# Patient Record
Sex: Male | Born: 1990 | Race: Black or African American | Hispanic: No | Marital: Single | Smoking: Current every day smoker
Health system: Southern US, Community
[De-identification: ages and names within clinical notes are randomized; demographics above are authoritative.]

## PROBLEM LIST (undated history)

## (undated) DIAGNOSIS — J45909 Unspecified asthma, uncomplicated: Secondary | ICD-10-CM

## (undated) HISTORY — PX: APPENDECTOMY: SHX54

---
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
## 2009-11-08 ENCOUNTER — Emergency Department (HOSPITAL_COMMUNITY)
Admission: EM | Admit: 2009-11-08 | Discharge: 2009-11-08 | Payer: Self-pay | Source: Home / Self Care | Admitting: Emergency Medicine

## 2010-01-11 ENCOUNTER — Emergency Department (HOSPITAL_COMMUNITY)
Admission: EM | Admit: 2010-01-11 | Discharge: 2010-01-11 | Payer: Self-pay | Source: Home / Self Care | Admitting: Emergency Medicine

## 2010-01-14 ENCOUNTER — Emergency Department (HOSPITAL_COMMUNITY): Admission: EM | Admit: 2010-01-14 | Discharge: 2009-12-27 | Payer: Self-pay | Admitting: Emergency Medicine

## 2010-03-08 ENCOUNTER — Emergency Department (HOSPITAL_COMMUNITY)
Admission: EM | Admit: 2010-03-08 | Discharge: 2010-03-08 | Payer: Self-pay | Source: Home / Self Care | Admitting: Emergency Medicine

## 2010-03-08 LAB — RAPID STREP SCREEN (MED CTR MEBANE ONLY): Streptococcus, Group A Screen (Direct): POSITIVE — AB

## 2010-04-22 LAB — URINALYSIS, ROUTINE W REFLEX MICROSCOPIC
Bilirubin Urine: NEGATIVE
Glucose, UA: NEGATIVE mg/dL
Hgb urine dipstick: NEGATIVE
Ketones, ur: NEGATIVE mg/dL
Nitrite: NEGATIVE
Protein, ur: NEGATIVE mg/dL
Specific Gravity, Urine: 1.025 (ref 1.005–1.030)
Urobilinogen, UA: 1 mg/dL (ref 0.0–1.0)
pH: 7 (ref 5.0–8.0)

## 2010-04-22 LAB — HEMOCCULT GUIAC POC 1CARD (OFFICE): Fecal Occult Bld: NEGATIVE

## 2010-04-24 ENCOUNTER — Emergency Department (HOSPITAL_COMMUNITY): Payer: Medicaid Other

## 2010-04-24 ENCOUNTER — Emergency Department (HOSPITAL_COMMUNITY)
Admission: EM | Admit: 2010-04-24 | Discharge: 2010-04-24 | Disposition: A | Discharge status: Home / Self Care | Payer: Medicaid Other | Attending: Emergency Medicine | Admitting: Emergency Medicine

## 2010-04-24 DIAGNOSIS — R220 Localized swelling, mass and lump, head: Secondary | ICD-10-CM | POA: Insufficient documentation

## 2010-04-24 DIAGNOSIS — Y92009 Unspecified place in unspecified non-institutional (private) residence as the place of occurrence of the external cause: Secondary | ICD-10-CM | POA: Insufficient documentation

## 2010-04-24 DIAGNOSIS — F341 Dysthymic disorder: Secondary | ICD-10-CM | POA: Insufficient documentation

## 2010-04-24 DIAGNOSIS — S01501A Unspecified open wound of lip, initial encounter: Secondary | ICD-10-CM | POA: Insufficient documentation

## 2010-04-24 DIAGNOSIS — H538 Other visual disturbances: Secondary | ICD-10-CM | POA: Insufficient documentation

## 2010-04-24 DIAGNOSIS — Z79899 Other long term (current) drug therapy: Secondary | ICD-10-CM | POA: Insufficient documentation

## 2010-04-24 DIAGNOSIS — R51 Headache: Secondary | ICD-10-CM | POA: Insufficient documentation

## 2010-04-24 DIAGNOSIS — R221 Localized swelling, mass and lump, neck: Secondary | ICD-10-CM | POA: Insufficient documentation

## 2010-05-20 ENCOUNTER — Emergency Department (HOSPITAL_COMMUNITY): Payer: Medicaid Other

## 2010-05-20 ENCOUNTER — Emergency Department (HOSPITAL_COMMUNITY)
Admission: EM | Admit: 2010-05-20 | Discharge: 2010-05-20 | Disposition: A | Discharge status: Other Acute Inpatient Hospital | Payer: Medicaid Other | Source: Home / Self Care | Attending: Emergency Medicine | Admitting: Emergency Medicine

## 2010-05-20 ENCOUNTER — Observation Stay (HOSPITAL_COMMUNITY)
Admission: EM | Admit: 2010-05-20 | Discharge: 2010-05-21 | Disposition: A | Discharge status: Home / Self Care | Payer: Medicaid Other | Source: Ambulatory Visit | Attending: General Surgery | Admitting: General Surgery

## 2010-05-20 DIAGNOSIS — S71009A Unspecified open wound, unspecified hip, initial encounter: Principal | ICD-10-CM | POA: Insufficient documentation

## 2010-05-20 DIAGNOSIS — R209 Unspecified disturbances of skin sensation: Secondary | ICD-10-CM | POA: Insufficient documentation

## 2010-05-20 DIAGNOSIS — S71109A Unspecified open wound, unspecified thigh, initial encounter: Principal | ICD-10-CM | POA: Insufficient documentation

## 2010-05-20 DIAGNOSIS — Z23 Encounter for immunization: Secondary | ICD-10-CM | POA: Insufficient documentation

## 2010-05-20 DIAGNOSIS — Y929 Unspecified place or not applicable: Secondary | ICD-10-CM | POA: Insufficient documentation

## 2010-05-20 DIAGNOSIS — F3289 Other specified depressive episodes: Secondary | ICD-10-CM | POA: Insufficient documentation

## 2010-05-20 DIAGNOSIS — W3400XA Accidental discharge from unspecified firearms or gun, initial encounter: Secondary | ICD-10-CM | POA: Insufficient documentation

## 2010-05-20 DIAGNOSIS — J45909 Unspecified asthma, uncomplicated: Secondary | ICD-10-CM | POA: Insufficient documentation

## 2010-05-20 DIAGNOSIS — S3120XA Unspecified open wound of penis, initial encounter: Secondary | ICD-10-CM | POA: Insufficient documentation

## 2010-05-20 DIAGNOSIS — F329 Major depressive disorder, single episode, unspecified: Secondary | ICD-10-CM | POA: Insufficient documentation

## 2010-05-20 DIAGNOSIS — F17200 Nicotine dependence, unspecified, uncomplicated: Secondary | ICD-10-CM | POA: Insufficient documentation

## 2010-05-20 LAB — DIFFERENTIAL
Basophils Absolute: 0 10*3/uL (ref 0.0–0.1)
Eosinophils Absolute: 0.2 10*3/uL (ref 0.0–0.7)
Lymphocytes Relative: 46 % (ref 12–46)
Lymphs Abs: 2.5 10*3/uL (ref 0.7–4.0)
Monocytes Absolute: 0.6 10*3/uL (ref 0.1–1.0)
Monocytes Relative: 10 % (ref 3–12)
Neutro Abs: 2.1 10*3/uL (ref 1.7–7.7)
Neutrophils Relative %: 39 % — ABNORMAL LOW (ref 43–77)

## 2010-05-20 LAB — POCT I-STAT, CHEM 8
BUN: 7 mg/dL (ref 6–23)
Calcium, Ion: 1.18 mmol/L (ref 1.12–1.32)
Chloride: 103 mEq/L (ref 96–112)
Glucose, Bld: 103 mg/dL — ABNORMAL HIGH (ref 70–99)
HCT: 51 % (ref 39.0–52.0)
Hemoglobin: 17.3 g/dL — ABNORMAL HIGH (ref 13.0–17.0)
Potassium: 3.5 mEq/L (ref 3.5–5.1)

## 2010-05-20 LAB — CBC
HCT: 45.2 % (ref 39.0–52.0)
Hemoglobin: 14.7 g/dL (ref 13.0–17.0)
MCH: 27.3 pg (ref 26.0–34.0)
MCHC: 32.5 g/dL (ref 30.0–36.0)
MCV: 83.9 fL (ref 78.0–100.0)
RBC: 5.39 MIL/uL (ref 4.22–5.81)
RDW: 15.6 % — ABNORMAL HIGH (ref 11.5–15.5)
WBC: 5.4 10*3/uL (ref 4.0–10.5)

## 2010-05-20 LAB — TYPE AND SCREEN
ABO/RH(D): O POS
Antibody Screen: NEGATIVE

## 2010-05-20 LAB — ABO/RH: ABO/RH(D): O POS

## 2010-05-20 LAB — MRSA PCR SCREENING: MRSA by PCR: NEGATIVE

## 2010-05-21 LAB — CBC
HCT: 40.5 % (ref 39.0–52.0)
Hemoglobin: 13.6 g/dL (ref 13.0–17.0)
MCH: 28.4 pg (ref 26.0–34.0)
MCHC: 33.6 g/dL (ref 30.0–36.0)
MCV: 84.6 fL (ref 78.0–100.0)
Platelets: 229 10*3/uL (ref 150–400)
RBC: 4.79 MIL/uL (ref 4.22–5.81)
RDW: 15.9 % — ABNORMAL HIGH (ref 11.5–15.5)
WBC: 8.3 10*3/uL (ref 4.0–10.5)
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
## 2010-06-25 NOTE — Op Note (Signed)
[name], Derek Davis                        ACCOUNT NO.:  [id number]   MEDICAL RECORD NO.:  [id number]                   PATIENT TYPE:  AMB   LOCATION:  DSC                                  FACILITY:  [facility]   PHYSICIAN:  Leonia Corona, M.D.               DATE OF BIRTH:  05/07/1990   DATE OF PROCEDURE:  06/02/2003  DATE OF DISCHARGE:                                 OPERATIVE REPORT   PREOPERATIVE DIAGNOSIS:  Mild degree of phimosis.   POSTOPERATIVE DIAGNOSIS:  Mild degree of phimosis.   OPERATION PERFORMED:  Circumcision.   SURGEON:  Leonia Corona, M.D.   ASSISTANT:  Nurse.   ANESTHESIA:  General laryngeal mask.   INDICATIONS FOR PROCEDURE:  This 21 year old male child was essentially seen  and evaluated for an elective circumcision.  Clinical examination revealed a  very mild degree of phimosis; however, per the request of the mother,  circumcision was performed.   DESCRIPTION OF PROCEDURE:  The patient was brought to the operating room and  placed supine on the operating table.  General laryngeal mask anesthesia was  given.  The penis and surrounding area of perineum was cleaned, prepped and  draped in the usual manner.  Approximately 4 mL of 0.25% Marcaine without  epinephrine was infiltrated at the base of the penis dorsally for dorsal  penile block for postoperative pain control.  Two hemostats were applied to  the preputial skin which was first dilated and retracted completely to  expose the coronal sulcus and adhesions between the preputial skin and the  glans were cleared.  Circumferential incision was marked on the outer  preputial skin at the level of the coronal sulcus and a circumferential  incision was made with knife.  The outer preputial skin was dissected with  the help of scissors.  Blunt and sharp dissection using cautery to divide  the fibrous connective tissue between the inner and outer layer of the  preputial skin.  Having separated the outer  preputial skin, a dorsal slit  was created by crushing with a clamp and dividing with scissors.  The inner  layer was divided approximately 4 to 5 mm short of reaching up to the  coronary sulcus.  The inner skin was then divided with scissors  circumferentially leaving a cuff of about 5 mm surrounding the coronal  sulcus.  The oozing and bleeding spots were cauterized.  The two divided  layers of the preputial skin were then approximated using 4-0 chromic  catgut.  The first suture was placed at the frenulum where a bleeder was  first ligated using 5-0 chromic catgut and then a U-stitch was placed and  tagged.  The second stitch was placed at 12 o'clock position approximating  inner and outer preputial skin and the stitch was tagged. Then five stitches  were placed in each half of the circumference in interrupted fashion using 4-  0 chromic catgut.  After  completing the suture line, no oozing or bleeding  was noted.  Tagged sutures were cut. Wound was cleaned and dried.  It was  wrapped with Vaseline gauze and sterile dressing,  held in position with  Coban.  Exposed part of the glans penis was covered with Neosporin ointment.  The patient tolerated the procedure very well which was smooth and  uneventful.  The patient was later extubated and transported to recovery  room in good stable condition.                                               Leonia Corona, M.D.    SF/MEDQ  D:  06/02/2003  T:  06/02/2003  Job:  [id number]   cc:   [facility]

## 2010-11-02 ENCOUNTER — Emergency Department (HOSPITAL_COMMUNITY)
Admission: EM | Admit: 2010-11-02 | Discharge: 2010-11-03 | Discharge status: Against Medical Advice | Payer: Medicaid Other | Attending: Emergency Medicine | Admitting: Emergency Medicine

## 2010-11-02 DIAGNOSIS — Z0389 Encounter for observation for other suspected diseases and conditions ruled out: Secondary | ICD-10-CM | POA: Insufficient documentation

## 2010-11-10 ENCOUNTER — Emergency Department (HOSPITAL_COMMUNITY)
Admission: EM | Admit: 2010-11-10 | Discharge: 2010-11-10 | Disposition: A | Discharge status: Home / Self Care | Payer: Medicaid Other | Attending: Emergency Medicine | Admitting: Emergency Medicine

## 2010-11-10 DIAGNOSIS — R109 Unspecified abdominal pain: Secondary | ICD-10-CM | POA: Insufficient documentation

## 2010-11-10 DIAGNOSIS — R10816 Epigastric abdominal tenderness: Secondary | ICD-10-CM | POA: Insufficient documentation

## 2010-11-18 ENCOUNTER — Emergency Department (HOSPITAL_COMMUNITY): Payer: Medicaid Other

## 2010-11-18 ENCOUNTER — Inpatient Hospital Stay (HOSPITAL_COMMUNITY)
Admission: EM | Admit: 2010-11-18 | Discharge: 2010-11-21 | DRG: 087 | Disposition: A | Discharge status: Home / Self Care | Payer: Medicaid Other | Attending: General Surgery | Admitting: General Surgery

## 2010-11-18 ENCOUNTER — Inpatient Hospital Stay (HOSPITAL_COMMUNITY): Payer: Medicaid Other

## 2010-11-18 DIAGNOSIS — S060X9A Concussion with loss of consciousness of unspecified duration, initial encounter: Secondary | ICD-10-CM

## 2010-11-18 DIAGNOSIS — S065XAA Traumatic subdural hemorrhage with loss of consciousness status unknown, initial encounter: Principal | ICD-10-CM | POA: Diagnosis present

## 2010-11-18 DIAGNOSIS — S065X9A Traumatic subdural hemorrhage with loss of consciousness of unspecified duration, initial encounter: Principal | ICD-10-CM | POA: Diagnosis present

## 2010-11-18 DIAGNOSIS — S069X9A Unspecified intracranial injury with loss of consciousness of unspecified duration, initial encounter: Secondary | ICD-10-CM

## 2010-11-18 DIAGNOSIS — Y92009 Unspecified place in unspecified non-institutional (private) residence as the place of occurrence of the external cause: Secondary | ICD-10-CM

## 2010-11-18 DIAGNOSIS — IMO0002 Reserved for concepts with insufficient information to code with codable children: Secondary | ICD-10-CM | POA: Diagnosis present

## 2010-11-18 LAB — DIFFERENTIAL
Basophils Absolute: 0 10*3/uL (ref 0.0–0.1)
Basophils Relative: 0 % (ref 0–1)
Eosinophils Absolute: 0.2 10*3/uL (ref 0.0–0.7)
Eosinophils Relative: 2 % (ref 0–5)
Lymphocytes Relative: 15 % (ref 12–46)
Monocytes Absolute: 0.7 10*3/uL (ref 0.1–1.0)

## 2010-11-18 LAB — POCT I-STAT, CHEM 8
Calcium, Ion: 1.08 mmol/L — ABNORMAL LOW (ref 1.12–1.32)
Creatinine, Ser: 1.3 mg/dL (ref 0.50–1.35)
Glucose, Bld: 101 mg/dL — ABNORMAL HIGH (ref 70–99)
HCT: 48 % (ref 39.0–52.0)
Hemoglobin: 16.3 g/dL (ref 13.0–17.0)
TCO2: 16 mmol/L (ref 0–100)

## 2010-11-18 LAB — ACETAMINOPHEN LEVEL: Acetaminophen (Tylenol), Serum: <15 ug/mL (ref 10–30)

## 2010-11-18 LAB — GLUCOSE, CAPILLARY: Glucose-Capillary: 85 mg/dL (ref 70–99)

## 2010-11-18 LAB — CBC
HCT: 44.1 % (ref 39.0–52.0)
MCHC: 34.5 g/dL (ref 30.0–36.0)
Platelets: 243 10*3/uL (ref 150–400)
RDW: 14.4 % (ref 11.5–15.5)
WBC: 10.4 10*3/uL (ref 4.0–10.5)

## 2010-11-18 LAB — MRSA PCR SCREENING: MRSA by PCR: NEGATIVE

## 2010-11-18 LAB — RAPID URINE DRUG SCREEN, HOSP PERFORMED
Barbiturates: NOT DETECTED
Cocaine: NOT DETECTED

## 2010-11-18 LAB — HEPATIC FUNCTION PANEL
ALT: 41 U/L (ref 0–53)
Bilirubin, Direct: <0.1 mg/dL (ref 0.0–0.3)

## 2010-11-18 LAB — SALICYLATE LEVEL: Salicylate Lvl: <2 mg/dL — ABNORMAL LOW (ref 2.8–20.0)

## 2010-11-18 MED ORDER — IOHEXOL 300 MG/ML  SOLN
100.0000 mL | Freq: Once | INTRAMUSCULAR | Status: AC | PRN
  Administered 2010-11-18: 100 mL via INTRAVENOUS

## 2010-11-19 LAB — BASIC METABOLIC PANEL
CO2: 25 mEq/L (ref 19–32)
Glucose, Bld: 90 mg/dL (ref 70–99)
Potassium: 4.2 mEq/L (ref 3.5–5.1)
Sodium: 138 mEq/L (ref 135–145)

## 2010-11-19 LAB — CBC
HCT: 42.7 % (ref 39.0–52.0)
Hemoglobin: 14.3 g/dL (ref 13.0–17.0)
MCH: 29.4 pg (ref 26.0–34.0)
RBC: 4.87 MIL/uL (ref 4.22–5.81)

## 2010-11-22 ENCOUNTER — Emergency Department (HOSPITAL_COMMUNITY): Payer: Medicaid Other

## 2010-11-22 ENCOUNTER — Emergency Department (HOSPITAL_COMMUNITY)
Admission: EM | Admit: 2010-11-22 | Discharge: 2010-11-22 | Disposition: A | Discharge status: Home / Self Care | Payer: Medicaid Other | Attending: Emergency Medicine | Admitting: Emergency Medicine

## 2010-11-22 DIAGNOSIS — R110 Nausea: Secondary | ICD-10-CM | POA: Insufficient documentation

## 2010-11-22 DIAGNOSIS — R51 Headache: Secondary | ICD-10-CM | POA: Insufficient documentation

## 2010-11-22 DIAGNOSIS — S0990XA Unspecified injury of head, initial encounter: Secondary | ICD-10-CM | POA: Insufficient documentation

## 2010-11-22 DIAGNOSIS — R220 Localized swelling, mass and lump, head: Secondary | ICD-10-CM | POA: Insufficient documentation

## 2010-11-22 DIAGNOSIS — K219 Gastro-esophageal reflux disease without esophagitis: Secondary | ICD-10-CM | POA: Insufficient documentation

## 2010-11-22 DIAGNOSIS — F329 Major depressive disorder, single episode, unspecified: Secondary | ICD-10-CM | POA: Insufficient documentation

## 2010-11-22 DIAGNOSIS — Z79899 Other long term (current) drug therapy: Secondary | ICD-10-CM | POA: Insufficient documentation

## 2010-11-22 DIAGNOSIS — R221 Localized swelling, mass and lump, neck: Secondary | ICD-10-CM | POA: Insufficient documentation

## 2010-11-22 DIAGNOSIS — IMO0002 Reserved for concepts with insufficient information to code with codable children: Secondary | ICD-10-CM | POA: Insufficient documentation

## 2010-11-22 DIAGNOSIS — F3289 Other specified depressive episodes: Secondary | ICD-10-CM | POA: Insufficient documentation
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
## 2011-04-02 MED ORDER — ONDANSETRON HCL 4 MG/2ML IJ SOLN
INTRAMUSCULAR | Status: AC
  Filled 2011-04-02: qty 2

## 2011-04-02 MED ORDER — HYDROMORPHONE HCL PF 1 MG/ML IJ SOLN
INTRAMUSCULAR | Status: AC
  Filled 2011-04-02: qty 1

## 2013-04-03 ENCOUNTER — Emergency Department (HOSPITAL_COMMUNITY)
Admission: EM | Admit: 2013-04-03 | Discharge: 2013-04-03 | Disposition: A | Discharge status: Home / Self Care | Payer: Self-pay | Attending: Emergency Medicine | Admitting: Emergency Medicine

## 2013-04-03 ENCOUNTER — Emergency Department (HOSPITAL_COMMUNITY): Payer: Medicaid Other

## 2013-04-03 DIAGNOSIS — R51 Headache: Secondary | ICD-10-CM | POA: Insufficient documentation

## 2013-04-03 DIAGNOSIS — R0602 Shortness of breath: Secondary | ICD-10-CM | POA: Insufficient documentation

## 2013-04-03 DIAGNOSIS — R110 Nausea: Secondary | ICD-10-CM | POA: Insufficient documentation

## 2013-04-03 DIAGNOSIS — E860 Dehydration: Secondary | ICD-10-CM | POA: Insufficient documentation

## 2013-04-03 LAB — I-STAT CHEM 8, ED
BUN: 7 mg/dL (ref 6–23)
CALCIUM ION: 1.23 mmol/L (ref 1.12–1.23)
Chloride: 102 mEq/L (ref 96–112)
Creatinine, Ser: 0.8 mg/dL (ref 0.50–1.35)
Glucose, Bld: 80 mg/dL (ref 70–99)
HEMATOCRIT: 47 % (ref 39.0–52.0)
HEMOGLOBIN: 16 g/dL (ref 13.0–17.0)
Potassium: 4.1 mEq/L (ref 3.7–5.3)
SODIUM: 140 meq/L (ref 137–147)
TCO2: 25 mmol/L (ref 0–100)

## 2013-04-03 MED ORDER — ONDANSETRON 4 MG PO TBDP
4.0000 mg | ORAL_TABLET | Freq: Once | ORAL | Status: AC
  Administered 2013-04-03: 4 mg via ORAL
  Filled 2013-04-03: qty 1

## 2013-04-03 MED ORDER — ACETAMINOPHEN 325 MG PO TABS
650.0000 mg | ORAL_TABLET | Freq: Once | ORAL | Status: AC
  Administered 2013-04-03: 650 mg via ORAL
  Filled 2013-04-03: qty 2

## 2013-04-03 MED ORDER — SODIUM CHLORIDE 0.9 % IV BOLUS (SEPSIS)
1000.0000 mL | Freq: Once | INTRAVENOUS | Status: AC
  Administered 2013-04-03: 1000 mL via INTRAVENOUS
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
## 2013-04-03 NOTE — ED Notes (Signed)
Pt discharged.Vital signs stable and GCS 15 
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
## 2013-04-03 NOTE — Discharge Instructions (Signed)
Dehydration, Adult Dehydration is when you lose more fluids from the body than you take in. Vital organs like the kidneys, brain, and heart cannot function without a proper amount of fluids and salt. Any loss of fluids from the body can cause dehydration.  CAUSES   Vomiting.  Diarrhea.  Excessive sweating.  Excessive urine output.  Fever. SYMPTOMS  Mild dehydration  Thirst.  Dry lips.  Slightly dry mouth. Moderate dehydration  Very dry mouth.  Sunken eyes.  Skin does not bounce back quickly when lightly pinched and released.  Dark urine and decreased urine production.  Decreased tear production.  Headache. Severe dehydration  Very dry mouth.  Extreme thirst.  Rapid, weak pulse (more than 100 beats per minute at rest).  Cold hands and feet.  Not able to sweat in spite of heat and temperature.  Rapid breathing.  Blue lips.  Confusion and lethargy.  Difficulty being awakened.  Minimal urine production.  No tears. DIAGNOSIS  Your caregiver will diagnose dehydration based on your symptoms and your exam. Blood and urine tests will help confirm the diagnosis. The diagnostic evaluation should also identify the cause of dehydration. TREATMENT  Treatment of mild or moderate dehydration can often be done at home by increasing the amount of fluids that you drink. It is best to drink small amounts of fluid more often. Drinking too much at one time can make vomiting worse. Refer to the home care instructions below. Severe dehydration needs to be treated at the hospital where you will probably be given intravenous (IV) fluids that contain water and electrolytes. HOME CARE INSTRUCTIONS   Ask your caregiver about specific rehydration instructions.  Drink enough fluids to keep your urine clear or pale yellow.  Drink small amounts frequently if you have nausea and vomiting.  Eat as you normally do.  Avoid:  Foods or drinks high in sugar.  Carbonated  drinks.  Juice.  Extremely hot or cold fluids.  Drinks with caffeine.  Fatty, greasy foods.  Alcohol.  Tobacco.  Overeating.  Gelatin desserts.  Wash your hands well to avoid spreading bacteria and viruses.  Only take over-the-counter or prescription medicines for pain, discomfort, or fever as directed by your caregiver.  Ask your caregiver if you should continue all prescribed and over-the-counter medicines.  Keep all follow-up appointments with your caregiver. SEEK MEDICAL CARE IF:  You have abdominal pain and it increases or stays in one area (localizes).  You have a rash, stiff neck, or severe headache.  You are irritable, sleepy, or difficult to awaken.  You are weak, dizzy, or extremely thirsty. SEEK IMMEDIATE MEDICAL CARE IF:   You are unable to keep fluids down or you get worse despite treatment.  You have frequent episodes of vomiting or diarrhea.  You have blood or green matter (bile) in your vomit.  You have blood in your stool or your stool looks black and tarry.  You have not urinated in 6 to 8 hours, or you have only urinated a small amount of very dark urine.  You have a fever.  You faint. MAKE SURE YOU:   Understand these instructions.  Will watch your condition.  Will get help right away if you are not doing well or get worse. Document Released: 01/24/2005 Document Revised: 04/18/2011 Document Reviewed: 09/13/2010 [employer] Patient Information 2014 [employer].  

## 2013-09-24 ENCOUNTER — Emergency Department (HOSPITAL_COMMUNITY)
Admission: EM | Admit: 2013-09-24 | Discharge: 2013-09-25 | Disposition: A | Discharge status: Home / Self Care | Payer: Medicaid Other | Attending: Emergency Medicine | Admitting: Emergency Medicine

## 2013-09-24 ENCOUNTER — Emergency Department (HOSPITAL_COMMUNITY): Payer: Medicaid Other

## 2013-09-24 ENCOUNTER — Encounter (HOSPITAL_COMMUNITY): Payer: Self-pay | Admitting: Emergency Medicine

## 2013-09-24 DIAGNOSIS — S0003XA Contusion of scalp, initial encounter: Secondary | ICD-10-CM | POA: Insufficient documentation

## 2013-09-24 DIAGNOSIS — M79644 Pain in right finger(s): Secondary | ICD-10-CM

## 2013-09-24 DIAGNOSIS — F17200 Nicotine dependence, unspecified, uncomplicated: Secondary | ICD-10-CM | POA: Insufficient documentation

## 2013-09-24 DIAGNOSIS — S0083XA Contusion of other part of head, initial encounter: Secondary | ICD-10-CM | POA: Insufficient documentation

## 2013-09-24 DIAGNOSIS — J45909 Unspecified asthma, uncomplicated: Secondary | ICD-10-CM | POA: Insufficient documentation

## 2013-09-24 DIAGNOSIS — S01511A Laceration without foreign body of lip, initial encounter: Secondary | ICD-10-CM

## 2013-09-24 DIAGNOSIS — S1093XA Contusion of unspecified part of neck, initial encounter: Secondary | ICD-10-CM

## 2013-09-24 DIAGNOSIS — S6390XA Sprain of unspecified part of unspecified wrist and hand, initial encounter: Secondary | ICD-10-CM | POA: Insufficient documentation

## 2013-09-24 DIAGNOSIS — S01501A Unspecified open wound of lip, initial encounter: Secondary | ICD-10-CM | POA: Insufficient documentation

## 2013-09-24 DIAGNOSIS — S0990XA Unspecified injury of head, initial encounter: Secondary | ICD-10-CM | POA: Insufficient documentation

## 2013-09-24 HISTORY — DX: Unspecified asthma, uncomplicated: J45.909
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
## 2013-09-25 NOTE — ED Notes (Signed)
The patient wanted his discharge instructions and said he did not need them to be reviewed with him.  The patient left without signing.

## 2013-09-25 NOTE — Discharge Instructions (Signed)
Mouth Laceration A mouth laceration is a cut inside the mouth. TREATMENT  Because of all the bacteria in the mouth, lacerations are usually not stitched (sutured) unless the wound is gaping open. Sometimes, a couple sutures may be placed just to hold the edges of the wound together and to speed healing. Over the next 1 to 2 days, you will see that the wound edges appear gray in color. The edges may appear ragged and slightly spread apart. Because of all the normal bacteria in the mouth, these wounds are contaminated, but this is not an infection that needs antibiotics. Most wounds heal with no problems despite their appearance. HOME CARE INSTRUCTIONS   Rinse your mouth with a warm, saltwater wash 4 to 6 times per day, or as your caregiver instructs.  Continue oral hygiene and gentle tooth brushing as normal, if possible.  Do not eat or drink hot food or beverages while your mouth is still numb.  Eat a bland diet to avoid irritation from acidic foods.  Only take over-the-counter or prescription medicines for pain, discomfort, or fever as directed by your caregiver.  Follow up with your caregiver as instructed. You may need to see your caregiver for a wound check in 48 to 72 hours to make sure your wound is healing.  If your laceration was sutured, do not play with the sutures or knots with your tongue. If you do this, they will gradually loosen and may become untied. You may need a tetanus shot if:  You cannot remember when you had your last tetanus shot.  You have never had a tetanus shot. If you get a tetanus shot, your arm may swell, get red, and feel warm to the touch. This is common and not a problem. If you need a tetanus shot and you choose not to have one, there is a rare chance of getting tetanus. Sickness from tetanus can be serious. SEEK MEDICAL CARE IF:   You develop swelling or increasing pain in the wound or in other parts of your face.  You have a fever.  You develop  swollen, tender glands in the throat.  You notice the wound edges do not stay together after your sutures have been removed.  You see pus coming from the wound. Some drainage in the mouth is normal. MAKE SURE YOU:   Understand these instructions.  Will watch your condition.  Will get help right away if you are not doing well or get worse. Document Released: 01/24/2005 Document Revised: 04/18/2011 Document Reviewed: 07/29/2010 [facility] Patient Information 2015 [place], [place]. This information is not intended to replace advice given to you by your health care provider. Make sure you discuss any questions you have with your health care provider. Thumb Sprain Your exam shows you have a sprained thumb. This means the ligaments around the joint have been torn. Thumb sprains usually take 3-6 weeks to heal. However, severe, unstable sprains may need to be fixed surgically. Sometimes a small piece of bone is pulled off by the ligament. If this is not treated properly, a sprained thumb can lead to a painful, weak joint. Treatment helps reduce pain and shortens the period of disability. The thumb, and often the wrist, must remain splinted for the first 2-4 weeks to protect the joint. Keep your hand elevated and apply ice packs frequently to the injured area (20-30 minutes every 2-3 hours) for the next 2-4 days. This helps reduce swelling and control pain. Pain medicine may also be used for  several days. Motion and strengthening exercises may later be prescribed for the joint to return to normal function. Be sure to see your doctor for follow-up because your thumb joint may require further support with splints, bandages or tape. Please see your doctor or go to the emergency room right away if you have increased pain despite proper treatment, or a numb, cold, or pale thumb. Document Released: 03/03/2004 Document Revised: 04/18/2011 Document Reviewed: 01/26/2008 [facility] Patient Information 2015 [place],  [place]. This information is not intended to replace advice given to you by your health care provider. Make sure you discuss any questions you have with your health care provider.

## 2013-09-25 NOTE — ED Provider Notes (Signed)
Medical screening examination/treatment/procedure(s) were performed by non-physician practitioner and as supervising physician I was immediately available for consultation/collaboration.   EKG Interpretation None       David H Yao, MD 09/25/13 1017 

## 2014-04-25 ENCOUNTER — Emergency Department (HOSPITAL_COMMUNITY)
Admission: EM | Admit: 2014-04-25 | Discharge: 2014-04-26 | Disposition: A | Discharge status: Home / Self Care | Payer: Medicaid Other | Attending: Emergency Medicine | Admitting: Emergency Medicine

## 2014-04-25 DIAGNOSIS — Z720 Tobacco use: Secondary | ICD-10-CM | POA: Insufficient documentation

## 2014-04-25 DIAGNOSIS — F1310 Sedative, hypnotic or anxiolytic abuse, uncomplicated: Secondary | ICD-10-CM | POA: Insufficient documentation

## 2014-04-25 DIAGNOSIS — Z79899 Other long term (current) drug therapy: Secondary | ICD-10-CM | POA: Insufficient documentation

## 2014-04-25 DIAGNOSIS — J45901 Unspecified asthma with (acute) exacerbation: Secondary | ICD-10-CM | POA: Insufficient documentation

## 2014-04-25 DIAGNOSIS — F1910 Other psychoactive substance abuse, uncomplicated: Secondary | ICD-10-CM

## 2014-04-25 DIAGNOSIS — H578 Other specified disorders of eye and adnexa: Secondary | ICD-10-CM | POA: Insufficient documentation

## 2014-04-25 LAB — CBC
HCT: 47.1 % (ref 39.0–52.0)
Hemoglobin: 15.7 g/dL (ref 13.0–17.0)
MCH: 29.6 pg (ref 26.0–34.0)
MCHC: 33.3 g/dL (ref 30.0–36.0)
MCV: 88.9 fL (ref 78.0–100.0)
PLATELETS: 278 10*3/uL (ref 150–400)
RBC: 5.3 MIL/uL (ref 4.22–5.81)
RDW: 13.7 % (ref 11.5–15.5)
WBC: 4.4 10*3/uL (ref 4.0–10.5)

## 2014-04-25 LAB — RAPID URINE DRUG SCREEN, HOSP PERFORMED
Amphetamines: NOT DETECTED
BARBITURATES: NOT DETECTED
BENZODIAZEPINES: POSITIVE — AB
COCAINE: NOT DETECTED
Opiates: NOT DETECTED
TETRAHYDROCANNABINOL: NOT DETECTED

## 2014-04-25 LAB — SALICYLATE LEVEL: Salicylate Lvl: <4 mg/dL (ref 2.8–20.0)

## 2014-04-25 LAB — ACETAMINOPHEN LEVEL: Acetaminophen (Tylenol), Serum: <10 ug/mL — ABNORMAL LOW (ref 10–30)

## 2014-04-25 LAB — BASIC METABOLIC PANEL
ANION GAP: 10 (ref 5–15)
BUN: 6 mg/dL (ref 6–23)
CHLORIDE: 107 mmol/L (ref 96–112)
CO2: 23 mmol/L (ref 19–32)
Calcium: 9.3 mg/dL (ref 8.4–10.5)
Creatinine, Ser: 1.29 mg/dL (ref 0.50–1.35)
GFR calc Af Amer: 89 mL/min — ABNORMAL LOW (ref >90)
GFR calc non Af Amer: 76 mL/min — ABNORMAL LOW (ref >90)
GLUCOSE: 100 mg/dL — AB (ref 70–99)
Potassium: 3.9 mmol/L (ref 3.5–5.1)
Sodium: 140 mmol/L (ref 135–145)

## 2014-04-25 LAB — TROPONIN I: Troponin I: 0.04 ng/mL — ABNORMAL HIGH (ref <0.031)

## 2014-04-25 LAB — ETHANOL: ALCOHOL ETHYL (B): 151 mg/dL — AB (ref 0–9)

## 2014-04-25 MED ORDER — ACETAMINOPHEN 325 MG PO TABS
650.0000 mg | ORAL_TABLET | Freq: Once | ORAL | Status: AC
  Administered 2014-04-25: 650 mg via ORAL
  Filled 2014-04-25: qty 2

## 2014-04-25 NOTE — ED Notes (Signed)
Bed: [facility] Expected date:  Expected time:  Means of arrival:  Comments: EMS-[facility] 

## 2014-04-25 NOTE — ED Notes (Signed)
Pt is refusing to get vitals at this time

## 2014-04-25 NOTE — BH Assessment (Signed)
Tele Assessment Note   Derek Davis is an 24 y.o. male presenting to [facility] with [employer]. Pt reported that that he was running from the officers and while he was running he took a handful of Xanax. Pt reported that he took between 7-10 pills that he got from a friend. Pt denies SI,HI and AVH at this time. Pt reported that he attempted suicide several years ago when he was incarcerated. Pt reported that he is currently receiving mental health services through [place]; however he shared that he is non-compliant with his medication. Pt stated "I haven't taken my medication since July last year because it makes me go crazy". Pt did not report any drug abuse but stated "I drink a big ass bottle of 1800 every other day". Pt also stated "I don't have a problem with alcohol". Pt did not report any history of abuse at this time. Pt denied having access to weapons or firearms. Pt stated "they took it away from me last week". Pt also shared that he has several pending criminal charges such as resisting arrest, hit and run and possession of a firearm. It is recommended that pt follow up with his outpatient provider.   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  Past Medical History:  Past Medical History  Diagnosis Date  . Asthma     No past surgical history on file.  Family History: No family history on file.  Social History:  reports that he has been smoking Cigarettes.  He has been smoking about 0.20 packs per day. He has never used smokeless tobacco. He reports that he drinks alcohol. He reports that he does not use illicit drugs.  Additional Social History:  Alcohol / Drug Use History of alcohol / drug use?: Yes Substance #1 Name of Substance 1: Alcohol 1 - Age of First Use:  11 1 - Amount (size/oz): "a big bottle of 1800" 1 - Frequency: every other day  1 - Duration: ongoing  1 - Last Use / Amount: 04-25-14 "1/5  of Tequila"   CIWA: CIWA-Ar BP: 122/68 mmHg Pulse Rate: 92 COWS:     PATIENT STRENGTHS: (choose at least two) Average or above average intelligence Supportive family/friends  Allergies: No Known Allergies  Home Medications:  (Not in a hospital admission)  OB/GYN Status:  No LMP for male patient.  General Assessment Data Location of Assessment: [facility] ED Is this a Tele or Face-to-Face Assessment?: Face-to-Face Is this an Initial Assessment or a Re-assessment for this encounter?: Initial Assessment Living Arrangements: Parent Can pt return to current living arrangement?: Yes Admission Status: Voluntary Is patient capable of signing voluntary admission?: Yes Transfer from: Home Referral Source: Other ([employer])     [facility] Crisis Care Plan Living Arrangements: Parent Name of Psychiatrist: Vesta Mixer  Name of Therapist: [facility]   Education Status Is patient currently in school?: No  Risk to self with the past 6 months Suicidal Ideation: No Suicidal Intent: No Is patient at risk for suicide?: No Suicidal Plan?: No Access to Means: No What has been your use of drugs/alcohol within the last 12 months?: Pt did not report any drug use but shared that he drinks alcohol every other day.  Previous Attempts/Gestures: Yes How many times?: 1 ("when I was in prison I took some pills". ) Other Self Harm Risks: No other self harm risk identified at this time.  Triggers for Past Attempts: Other (Comment) (Prison ) Intentional Self Injurious Behavior: None Family Suicide History: No Recent  stressful life event(s): Legal Issues Persecutory voices/beliefs?: No Depression: Yes Depression Symptoms: Isolating, Loss of interest in usual pleasures, Feeling worthless/self pity, Feeling angry/irritable Substance abuse history and/or treatment for substance abuse?: Yes Suicide prevention information given to non-admitted patients: Yes  Risk to Others within the past 6 months Homicidal Ideation: No Thoughts of Harm to Others: No Current Homicidal Intent: No Current  Homicidal Plan: No Access to Homicidal Means: No Identified Victim: NA History of harm to others?: No Assessment of Violence: On admission Violent Behavior Description: No violent behaviors observed at this time. Pt is calm and cooperative.  Does patient have access to weapons?: No Criminal Charges Pending?: Yes Describe Pending Criminal Charges: Resisting arrest, hit and run, possession of a firearm  Does patient have a court date: Yes Court Date:  (Unknown )  Psychosis Hallucinations: None noted Delusions: None noted  Mental Status Report Appearance/Hygiene: In hospital gown Eye Contact: Good Motor Activity: Freedom of movement Speech: Logical/coherent Level of Consciousness: Alert Mood: Pleasant Affect: Appropriate to circumstance Anxiety Level: Minimal Thought Processes: Coherent, Relevant Judgement: Unimpaired Orientation: Situation, Time, Place, Person Obsessive Compulsive Thoughts/Behaviors: None  Cognitive Functioning Concentration: Normal Memory: Recent Intact, Remote Intact IQ: Average Insight: Fair Impulse Control: Fair Appetite: Good Weight Loss: 0 Weight Gain: 0 Sleep: Decreased Total Hours of Sleep: 4 Vegetative Symptoms: None  ADLScreening [facility] Patient's cognitive ability adequate to safely complete daily activities?: Yes Patient able to express need for assistance with ADLs?: Yes Independently performs ADLs?: Yes (appropriate for developmental age)  Prior Inpatient Therapy Prior Inpatient Therapy: Yes Prior Therapy Dates: 2014 Prior Therapy Facilty/Provider(s): [facility]  Reason for Treatment: Hallucinations  Prior Outpatient Therapy Prior Outpatient Therapy: Yes Prior Therapy Dates: 2014-present  Prior Therapy Facilty/Provider(s): [facility]  Reason for Treatment: Schizophrenia, ADHD  ADL Screening (condition at time of admission) Patient's cognitive ability adequate to safely complete daily activities?: Yes Is the patient  deaf or have difficulty hearing?: No Does the patient have difficulty seeing, even when wearing glasses/contacts?: No Does the patient have difficulty concentrating, remembering, or making decisions?: No Patient able to express need for assistance with ADLs?: Yes Does the patient have difficulty dressing or bathing?: No Independently performs ADLs?: Yes (appropriate for developmental age)       Abuse/Neglect Assessment (Assessment to be complete while patient is alone) Physical Abuse: Denies Verbal Abuse: Denies Sexual Abuse: Denies Exploitation of patient/patient's resources: Denies Self-Neglect: Denies          Additional Information 1:1 In Past 12 Months?: No CIRT Risk: No Elopement Risk: Yes Does patient have medical clearance?: No     Disposition:  Disposition Initial Assessment Completed for this Encounter: Yes Disposition of Patient: Outpatient treatment Type of outpatient treatment: Adult  Logen Fowle S 04/25/2014 10:24 PM

## 2014-04-25 NOTE — ED Provider Notes (Signed)
CSN: [id number]     Arrival date & time 04/25/14  1859 History   First MD Initiated Contact with Patient 04/25/14 1913     Chief Complaint  Patient presents with  . Medical Clearance     (Consider location/radiation/quality/duration/timing/severity/associated sxs/prior Treatment) HPI Comments: 24 year old male brought in by police after "passing out" when he was brought to jail this evening. Patient was being charged for an assault, rated from the police. While running from the police, patient reports taking a "handful" of pills that are reported to be Xanax around 5:30 PM. Patient does not know the strength of the Xanax and states they're not his, and does not know where he got them from. Admits to also drinking a fifth of tequila today. When he got to the jail, he "passed out", immediately woke up and then started complaining of an asthma exacerbation. No wheezing has been noted.  The history is provided by the patient and the police.    Past Medical History  Diagnosis Date  . Asthma    No past surgical history on file. No family history on file. History  Substance Use Topics  . Smoking status: Current Every Day Smoker -- 0.20 packs/day    Types: Cigarettes  . Smokeless tobacco: Never Used  . Alcohol Use: Yes     Comment: occ    Review of Systems  Respiratory: Positive for shortness of breath.   Neurological: Positive for syncope.  Psychiatric/Behavioral: Positive for behavioral problems.  All other systems reviewed and are negative.     Allergies  Review of patient's allergies indicates no known allergies.  Home Medications   Prior to Admission medications   Medication Sig Start Date End Date Taking? Authorizing Provider  albuterol (PROVENTIL HFA;VENTOLIN HFA) 108 (90 BASE) MCG/ACT inhaler Inhale 2 puffs into the lungs every 6 (six) hours as needed for wheezing or shortness of breath (wheezing).    Yes Historical Provider, MD  ALPRAZolam (XANAX PO) Take by mouth.    Yes Historical Provider, MD  budesonide-formoterol (SYMBICORT) 160-4.5 MCG/ACT inhaler Inhale 2 puffs into the lungs 2 (two) times daily.   Yes Historical Provider, MD   BP 122/68 mmHg  Pulse 92  Temp(Src) 97.4 F (36.3 C) (Oral)  Resp 23  SpO2 98% Physical Exam  Constitutional: He is oriented to person, place, and time. He appears well-developed and well-nourished. No distress.  HENT:  Head: Normocephalic and atraumatic.  Eyes: EOM are normal.  Bilateral conjunctival injection.  Neck: Normal range of motion. Neck supple.  Cardiovascular: Normal rate, regular rhythm and normal heart sounds.   Pulmonary/Chest: Effort normal and breath sounds normal.  Musculoskeletal: Normal range of motion. He exhibits no edema.  Neurological: He is alert and oriented to person, place, and time.  Skin: Skin is warm and dry.  Psychiatric: He is agitated and combative.  Nursing note and vitals reviewed.   ED Course  Procedures (including critical care time) Labs Review Labs Reviewed  BASIC METABOLIC PANEL - Abnormal; Notable for the following:    Glucose, Bld 100 (*)    GFR calc non Af Amer 76 (*)    GFR calc Af Amer 89 (*)    All other components within normal limits  URINE RAPID DRUG SCREEN (HOSP PERFORMED) - Abnormal; Notable for the following:    Benzodiazepines POSITIVE (*)    All other components within normal limits  ETHANOL - Abnormal; Notable for the following:    Alcohol, Ethyl (B) 151 (*)    All  other components within normal limits  ACETAMINOPHEN LEVEL - Abnormal; Notable for the following:    Acetaminophen (Tylenol), Serum <10.0 (*)    All other components within normal limits  TROPONIN I - Abnormal; Notable for the following:    Troponin I 0.04 (*)    All other components within normal limits  CBC  SALICYLATE LEVEL    Imaging Review No results found.   EKG Interpretation   Date/Time:  Friday April 25 2014 19:52:51 EDT Ventricular Rate:  90 PR Interval:  146 QRS  Duration: 79 QT Interval:  348 QTC Calculation: 426 R Axis:   73 Text Interpretation:  Sinus rhythm ST elevation suggests acute  pericarditis No significant change was found Confirmed by Manus GunningANCOUR  MD,  STEPHEN [phone]([id number]) on 04/25/2014 7:57:14 PM      MDM   Final diagnoses:  Substance abuse   NAD. AFVSS. Lungs clear. Poison control contacted and advised watching for ~6 hours and monitoring vitals. They also state recently "baggies" have been involved, and recommend activated charcoal and a rectal exam. There is no baggie involved according to police, and I spoke with my attending who does not feel this is necessary intervention at this time. EKG showing sinus rhythm with ST elevation suggesting acute pericarditis. Troponin 0.04. No chest pain. ETOH 151. UDS negative for cocaine. Repeat EKG unchanged. Plan to repeat troponin and re-assess. I spoke with Dr. Chilton Siiffany Wall, cardiology fellow who states the EKG may be pericarditis, not ACS, however not convincing for pericarditis. Requests call back once troponin results and will dispo at that time. Pt signed out to Elpidio AnisShari Upstill, PA-C at shift change. Troponin pending.  Kathrynn SpeedRobyn M Shaley Leavens, PA-C 04/26/14 1915  Glynn OctaveStephen Rancour, MD 04/26/14 2123

## 2014-04-25 NOTE — BH Assessment (Signed)
Assessment completed. Consulted Nucor Corporationobyn Hess, PA-C who agrees that pt does not meet inpatient criteria at this time due to pt denying SI, HI and AVH. Robyn Hess, PA-C has been informed of the recommendation.
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
## 2014-04-26 LAB — TROPONIN I: TROPONIN I: 0.03 ng/mL (ref <0.031)

## 2014-04-26 MED ORDER — IBUPROFEN 800 MG PO TABS: 800.0000 mg | ORAL_TABLET | Freq: Three times a day (TID) | ORAL | Status: AC

## 2014-04-26 NOTE — ED Notes (Signed)
Pt refused discharge vital signs and to sign discharge instructions. Pt left in police custody.

## 2014-04-26 NOTE — Discharge Instructions (Signed)
Polysubstance Abuse °When people abuse more than one drug or type of drug it is called polysubstance or polydrug abuse. For example, many smokers also drink alcohol. This is one form of polydrug abuse. Polydrug abuse also refers to the use of a drug to counteract an unpleasant effect produced by another drug. It may also be used to help with withdrawal from another drug. People who take stimulants may become agitated. Sometimes this agitation is countered with a tranquilizer. This helps protect against the unpleasant side effects. Polydrug abuse also refers to the use of different drugs at the same time.  °Anytime drug use is interfering with normal living activities, it has become abuse. This includes problems with family and friends. Psychological dependence has developed when your mind tells you that the drug is needed. This is usually followed by physical dependence which has developed when continuing increases of drug are required to get the same feeling or "high". This is known as addiction or chemical dependency. A person's risk is much higher if there is a history of chemical dependency in the family. °SIGNS OF CHEMICAL DEPENDENCY °· You have been told by friends or family that drugs have become a problem. °· You fight when using drugs. °· You are having blackouts (not remembering what you do while using). °· You feel sick from using drugs but continue using. °· You lie about use or amounts of drugs (chemicals) used. °· You need chemicals to get you going. °· You are suffering in work performance or in school because of drug use. °· You get sick from use of drugs but continue to use anyway. °· You need drugs to relate to people or feel comfortable in social situations. °· You use drugs to forget problems. °"Yes" answered to any of the above signs of chemical dependency indicates there are problems. The longer the use of drugs continues, the greater the problems will become. °If there is a family history of  drug or alcohol use, it is best not to experiment with these drugs. Continual use leads to tolerance. After tolerance develops more of the drug is needed to get the same feeling. This is followed by addiction. With addiction, drugs become the most important part of life. It becomes more important to take drugs than participate in the other usual activities of life. This includes relating to friends and family. Addiction is followed by dependency. Dependency is a condition where drugs are now needed not just to get high, but to feel normal. °Addiction cannot be cured but it can be stopped. This often requires outside help and the care of professionals. Treatment centers are listed in the yellow pages under: Cocaine, Narcotics, and Alcoholics Anonymous. Most hospitals and clinics can refer you to a specialized care center. Talk to your caregiver if you need help. °Document Released: 09/15/2004 Document Revised: 04/18/2011 Document Reviewed: 01/24/2005 °ExitCare® Patient Information ©2015 [employer]. This information is not intended to replace advice given to you by your health care provider. Make sure you discuss any questions you have with your health care provider. ° °

## 2014-04-26 NOTE — ED Notes (Signed)
Pt refused discharge vitals 
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
## 2014-07-07 ENCOUNTER — Emergency Department (HOSPITAL_COMMUNITY)
Admission: EM | Admit: 2014-07-07 | Discharge: 2014-07-07 | Disposition: A | Discharge status: Home / Self Care | Payer: Medicaid Other | Attending: Emergency Medicine | Admitting: Emergency Medicine

## 2014-07-07 ENCOUNTER — Encounter (HOSPITAL_COMMUNITY): Payer: Self-pay | Admitting: Nurse Practitioner

## 2014-07-07 DIAGNOSIS — Z720 Tobacco use: Secondary | ICD-10-CM | POA: Insufficient documentation

## 2014-07-07 DIAGNOSIS — Z79899 Other long term (current) drug therapy: Secondary | ICD-10-CM | POA: Insufficient documentation

## 2014-07-07 DIAGNOSIS — R360 Urethral discharge without blood: Secondary | ICD-10-CM | POA: Insufficient documentation

## 2014-07-07 DIAGNOSIS — M25512 Pain in left shoulder: Secondary | ICD-10-CM | POA: Insufficient documentation

## 2014-07-07 DIAGNOSIS — R300 Dysuria: Secondary | ICD-10-CM | POA: Insufficient documentation

## 2014-07-07 DIAGNOSIS — R369 Urethral discharge, unspecified: Secondary | ICD-10-CM

## 2014-07-07 DIAGNOSIS — J45909 Unspecified asthma, uncomplicated: Secondary | ICD-10-CM | POA: Insufficient documentation

## 2014-07-07 MED ORDER — AZITHROMYCIN 250 MG PO TABS
1000.0000 mg | ORAL_TABLET | Freq: Once | ORAL | Status: AC
  Administered 2014-07-07: 1000 mg via ORAL
  Filled 2014-07-07: qty 4

## 2014-07-07 MED ORDER — ONDANSETRON 4 MG PO TBDP
8.0000 mg | ORAL_TABLET | Freq: Once | ORAL | Status: AC
  Administered 2014-07-07: 8 mg via ORAL
  Filled 2014-07-07: qty 2

## 2014-07-07 MED ORDER — LIDOCAINE HCL (PF) 1 % IJ SOLN
2.0000 mL | Freq: Once | INTRAMUSCULAR | Status: AC
  Administered 2014-07-07: 2 mL
  Filled 2014-07-07: qty 5

## 2014-07-07 MED ORDER — METRONIDAZOLE 500 MG PO TABS
2000.0000 mg | ORAL_TABLET | Freq: Once | ORAL | Status: AC
  Administered 2014-07-07: 2000 mg via ORAL
  Filled 2014-07-07: qty 4

## 2014-07-07 MED ORDER — CEFTRIAXONE SODIUM 250 MG IJ SOLR
250.0000 mg | Freq: Once | INTRAMUSCULAR | Status: AC
  Administered 2014-07-07: 250 mg via INTRAMUSCULAR
  Filled 2014-07-07: qty 250
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
## 2014-07-07 NOTE — Discharge Instructions (Signed)
Read the information below.  Use the prescribed medication as directed.  Please discuss all new medications with your pharmacist.  You may return to the Emergency Department at any time for worsening condition or any new symptoms that concern you.    If you develop uncontrolled pain, weakness or numbness of the extremity, severe discoloration of the skin, or you are unable to move your shoulder, return to the ER for a recheck.

## 2014-07-07 NOTE — ED Notes (Signed)
He c/o L shoulder pain [facility] but he was told his xrays were normal.  He also has penile discharge and dysuria x 1 week.

## 2014-07-08 LAB — HIV ANTIBODY (ROUTINE TESTING W REFLEX): HIV SCREEN 4TH GENERATION: NONREACTIVE

## 2014-07-08 LAB — GC/CHLAMYDIA PROBE AMP (~~LOC~~) NOT AT ARMC
Chlamydia: POSITIVE — AB
Neisseria Gonorrhea: POSITIVE — AB

## 2014-07-09 ENCOUNTER — Telehealth (HOSPITAL_BASED_OUTPATIENT_CLINIC_OR_DEPARTMENT_OTHER): Payer: Self-pay | Admitting: Emergency Medicine

## 2014-07-09 LAB — RPR: RPR Ser Ql: NONREACTIVE

## 2014-07-09 NOTE — Telephone Encounter (Signed)
Notified of + GC and + chlamydia, educated re abstinence and notification of sexual partner(s) 

## 2014-09-01 ENCOUNTER — Emergency Department (HOSPITAL_COMMUNITY)
Admission: EM | Admit: 2014-09-01 | Discharge: 2014-09-01 | Disposition: A | Discharge status: Home / Self Care | Payer: Medicaid Other | Attending: Emergency Medicine | Admitting: Emergency Medicine

## 2014-09-01 ENCOUNTER — Emergency Department (HOSPITAL_COMMUNITY): Payer: Medicaid Other

## 2014-09-01 ENCOUNTER — Encounter (HOSPITAL_COMMUNITY): Payer: Self-pay | Admitting: Emergency Medicine

## 2014-09-01 DIAGNOSIS — J029 Acute pharyngitis, unspecified: Secondary | ICD-10-CM | POA: Insufficient documentation

## 2014-09-01 DIAGNOSIS — R059 Cough, unspecified: Secondary | ICD-10-CM

## 2014-09-01 DIAGNOSIS — Z79899 Other long term (current) drug therapy: Secondary | ICD-10-CM | POA: Insufficient documentation

## 2014-09-01 DIAGNOSIS — J45909 Unspecified asthma, uncomplicated: Secondary | ICD-10-CM | POA: Insufficient documentation

## 2014-09-01 DIAGNOSIS — R05 Cough: Secondary | ICD-10-CM

## 2014-09-01 MED ORDER — HYDROCODONE-HOMATROPINE 5-1.5 MG/5ML PO SYRP: 5.0000 mL | ORAL_SOLUTION | Freq: Four times a day (QID) | ORAL | Status: DC | PRN

## 2014-09-01 NOTE — Discharge Instructions (Signed)
Cough, Adult Take Hycodan for cough. Follow up with [place] and wellness.  A cough is a reflex. It helps you clear your throat and airways. A cough can help heal your body. A cough can last 2 or 3 weeks (acute) or may last more than 8 weeks (chronic). Some common causes of a cough can include an infection, allergy, or a cold. HOME CARE  Only take medicine as told by your doctor.  If given, take your medicines (antibiotics) as told. Finish them even if you start to feel better.  Use a cold steam vaporizer or humidifier in your home. This can help loosen thick spit (secretions).  Sleep so you are almost sitting up (semi-upright). Use pillows to do this. This helps reduce coughing.  Rest as needed.  Stop smoking if you smoke. GET HELP RIGHT AWAY IF:  You have yellowish-white fluid (pus) in your thick spit.  Your cough gets worse.  Your medicine does not reduce coughing, and you are losing sleep.  You cough up blood.  You have trouble breathing.  Your pain gets worse and medicine does not help.  You have a fever. MAKE SURE YOU:   Understand these instructions.  Will watch your condition.  Will get help right away if you are not doing well or get worse. Document Released: 10/07/2010 Document Revised: 06/10/2013 Document Reviewed: 10/07/2010 [facility] Patient Information 2015 [place], [place]. This information is not intended to replace advice given to you by your health care provider. Make sure you discuss any questions you have with your health care provider.

## 2014-09-01 NOTE — ED Notes (Addendum)
The patient said he has coughing for four days and has chest congestion.  The patient said he has been coughing yellow sputum and it has been streaked with blood.  The patient said his mother was diagnosed with bronchitis so he wants to make he doesn't have bronchitis.  He rates his pain 8/10.  He says he was just prescribed new psych meds Abilify and two other meds so he wants to know if maybe he is experiencing side effects from them.

## 2014-09-01 NOTE — ED Provider Notes (Signed)
CSN: [id number]     Arrival date & time 09/01/14  1550 History   This chart was scribed for Catha Gosselin, PA-C working with Gerhard Munch, MD by Evon Slack, ED Scribe. This patient was seen in room TR06C/TR06C and the patient's care was started at 7:36 PM.     Chief Complaint  Patient presents with  . Cough    The patient said he has coughing for four days and has chest congestion.  The patient said he has been coughing yellow sputum and it has been streaked with blood.    Patient is a 24 y.o. male presenting with cough. The history is provided by the patient. No language interpreter was used.  Cough Associated symptoms: sore throat   Associated symptoms: no chills and no fever    HPI Comments: Derek Davis is a 24 y.o. male with PMHx of asthma who presents to the Emergency Department complaining of productive cough of yellow sputum with streaks of blood onset 4 days prior. Pt states that he has associated chest congestion, sore throat. The sore throat has since resolved. Pt does report some post-tussive retching as if he was going to vomit. Pt states that he has tried Nyquil with no relief. Pt doesn't report any recent sick contacts. Pt reports Hx of asthma that he is prescribed an albuterol inhaler and Symbicort for. Pt denies fever, chills or other related symptoms. Pt denies any recent travel.   Past Medical History  Diagnosis Date  . Asthma    History reviewed. No pertinent past surgical history. History reviewed. No pertinent family history. History  Substance Use Topics  . Smoking status: Current Every Day Smoker -- 0.20 packs/day    Types: Cigarettes  . Smokeless tobacco: Never Used  . Alcohol Use: Yes     Comment: occ    Review of Systems  Constitutional: Negative for fever and chills.  HENT: Positive for congestion and sore throat.   Respiratory: Positive for cough.      Allergies  Review of patient's allergies indicates no known allergies.  Home  Medications   Prior to Admission medications   Medication Sig Start Date End Date Taking? Authorizing Provider  albuterol (PROVENTIL HFA;VENTOLIN HFA) 108 (90 BASE) MCG/ACT inhaler Inhale 2 puffs into the lungs every 6 (six) hours as needed for wheezing or shortness of breath (wheezing).    Yes Historical Provider, MD  ARIPiprazole (ABILIFY) 10 MG tablet Take 10 mg by mouth daily.   Yes Historical Provider, MD  budesonide-formoterol (SYMBICORT) 160-4.5 MCG/ACT inhaler Inhale 2 puffs into the lungs 2 (two) times daily.   Yes Historical Provider, MD  traZODone (DESYREL) 50 MG tablet Take 50 mg by mouth at bedtime.   Yes Historical Provider, MD  Vortioxetine HBr (TRINTELLIX) 10 MG TABS Take 1 tablet by mouth every morning.   Yes Historical Provider, MD  HYDROcodone-homatropine (HYCODAN) 5-1.5 MG/5ML syrup Take 5 mLs by mouth every 6 (six) hours as needed for cough. 09/01/14   Calvert Charland Patel-Mills, PA-C  ibuprofen (ADVIL,MOTRIN) 800 MG tablet Take 1 tablet (800 mg total) by mouth 3 (three) times daily. Patient not taking: Reported on 09/01/2014 04/26/14   Elpidio Anis, PA-C   BP 127/78 mmHg  Pulse 63  Temp(Src) 97.8 F (36.6 C) (Oral)  Resp 16  Ht 6' (1.829 m)  Wt 180 lb (81.647 kg)  BMI 24.41 kg/m2  SpO2 97%   Physical Exam  Constitutional: He is oriented to person, place, and time. He appears well-developed and well-nourished. No  distress.  HENT:  Head: Normocephalic and atraumatic.  Mouth/Throat: Uvula is midline, oropharynx is clear and moist and mucous membranes are normal. No trismus in the jaw. No uvula swelling. No oropharyngeal exudate, posterior oropharyngeal edema, posterior oropharyngeal erythema or tonsillar abscesses.  Eyes: Conjunctivae and EOM are normal.  Neck: Neck supple. No tracheal deviation present.  Cardiovascular: Normal rate.   Pulmonary/Chest: Effort normal and breath sounds normal. No accessory muscle usage. No respiratory distress. He has no decreased breath sounds.  He has no wheezes. He has no rhonchi. He has no rales. He exhibits no tenderness.  No wheezing on exam.  Musculoskeletal: Normal range of motion.  Neurological: He is alert and oriented to person, place, and time.  Skin: Skin is warm and dry.  Psychiatric: He has a normal mood and affect. His behavior is normal.  Nursing note and vitals reviewed.   ED Course  Procedures (including critical care time) DIAGNOSTIC STUDIES: Oxygen Saturation is 98% on RA, normal by my interpretation.    COORDINATION OF CARE: 7:52 PM-Discussed treatment plan with pt at bedside and pt agreed to plan.     Labs Review Labs Reviewed - No data to display  Imaging Review Dg Chest 2 View  09/01/2014   CLINICAL DATA:  Chest pain and bloody sputum  EXAM: CHEST - 2 VIEW  COMPARISON:  04/03/2013  FINDINGS: The heart size and mediastinal contours are within normal limits. Both lungs are clear. The visualized skeletal structures are unremarkable.  IMPRESSION: No active disease.   Electronically Signed   By: Alcide Clever M.D.   On: 09/01/2014 16:20     EKG Interpretation None      MDM   Final diagnoses:  Cough   Patient with history of asthma presents for upper respiratory symptoms. He is complaining of cough. His lungs sound clear and chest x-ray is negative for edema, infiltrate, pneumothorax. His vitals are normal and 97% oxygen on room air. No difficulty breathing. He has had no fever or airway compromise. The patient was given Hycodan for cough. I discussed following up at [facility] and wellness. Patient verbally agrees with the plan. I personally performed the services described in this documentation, which was scribed in my presence. The recorded information has been reviewed and is accurate.      Catha Gosselin, PA-C 09/01/14 2004  Gerhard Munch, MD 09/01/14 2201

## 2014-09-01 NOTE — ED Notes (Signed)
Pt A&Ox4, ambulatory a d/c with steady gait, NAD 

## 2014-09-11 ENCOUNTER — Emergency Department (HOSPITAL_COMMUNITY)
Admission: EM | Admit: 2014-09-11 | Discharge: 2014-09-11 | Disposition: A | Discharge status: Home / Self Care | Payer: Medicaid Other | Attending: Emergency Medicine | Admitting: Emergency Medicine

## 2014-09-11 ENCOUNTER — Encounter (HOSPITAL_COMMUNITY): Payer: Self-pay | Admitting: Emergency Medicine

## 2014-09-11 DIAGNOSIS — A64 Unspecified sexually transmitted disease: Secondary | ICD-10-CM

## 2014-09-11 DIAGNOSIS — J45909 Unspecified asthma, uncomplicated: Secondary | ICD-10-CM | POA: Insufficient documentation

## 2014-09-11 DIAGNOSIS — Z720 Tobacco use: Secondary | ICD-10-CM | POA: Insufficient documentation

## 2014-09-11 DIAGNOSIS — Z79899 Other long term (current) drug therapy: Secondary | ICD-10-CM | POA: Insufficient documentation

## 2014-09-11 DIAGNOSIS — Z202 Contact with and (suspected) exposure to infections with a predominantly sexual mode of transmission: Secondary | ICD-10-CM | POA: Insufficient documentation

## 2014-09-11 DIAGNOSIS — Z7951 Long term (current) use of inhaled steroids: Secondary | ICD-10-CM | POA: Insufficient documentation

## 2014-09-11 MED ORDER — LIDOCAINE HCL 1 % IJ SOLN
INTRAMUSCULAR | Status: AC
  Administered 2014-09-11: 20 mL
  Filled 2014-09-11: qty 20

## 2014-09-11 MED ORDER — CEFTRIAXONE SODIUM 250 MG IJ SOLR
250.0000 mg | Freq: Once | INTRAMUSCULAR | Status: AC
  Administered 2014-09-11: 250 mg via INTRAMUSCULAR
  Filled 2014-09-11: qty 250

## 2014-09-11 MED ORDER — METRONIDAZOLE 500 MG PO TABS
2000.0000 mg | ORAL_TABLET | Freq: Once | ORAL | Status: AC
  Administered 2014-09-11: 2000 mg via ORAL
  Filled 2014-09-11: qty 4

## 2014-09-11 MED ORDER — AZITHROMYCIN 250 MG PO TABS
1000.0000 mg | ORAL_TABLET | Freq: Once | ORAL | Status: AC
  Administered 2014-09-11: 1000 mg via ORAL
  Filled 2014-09-11: qty 4

## 2014-09-11 NOTE — ED Notes (Signed)
Pt reports penile discharge and burning on urination x 1 week. C/o persistent sore throat, denies oral sex

## 2014-09-11 NOTE — ED Provider Notes (Signed)
CSN: [id number]     Arrival date & time 09/11/14  1046 History   First MD Initiated Contact with Patient 09/11/14 1105     Chief Complaint  Patient presents with  . Penile Discharge    1 week hx of penile discharge     (Consider location/radiation/quality/duration/timing/severity/associated sxs/prior Treatment) HPI Derek Davis is a 24 y.o. male Who presents to emergency department with complaint of penile discharge. This started a week ago. He reports multiple different partners.Denies any urinary symptoms. Denies any skin sores or lesions. States he does not always use protection. Denies fever or chills. No scrotal pain or swelling.  Past Medical History  Diagnosis Date  . Asthma    History reviewed. No pertinent past surgical history. Family History  Problem Relation Age of Onset  . Hypertension Mother    History  Substance Use Topics  . Smoking status: Current Every Day Smoker -- 0.20 packs/day    Types: Cigarettes  . Smokeless tobacco: Never Used  . Alcohol Use: Yes     Comment: occ    Review of Systems  Constitutional: Negative for fever and chills.  Genitourinary: Positive for discharge. Negative for dysuria, frequency, hematuria, penile swelling, scrotal swelling, genital sores, penile pain and testicular pain.      Allergies  Review of patient's allergies indicates no known allergies.  Home Medications   Prior to Admission medications   Medication Sig Start Date End Date Taking? Authorizing Provider  albuterol (PROVENTIL HFA;VENTOLIN HFA) 108 (90 BASE) MCG/ACT inhaler Inhale 2 puffs into the lungs every 6 (six) hours as needed for wheezing or shortness of breath (wheezing).     Historical Provider, MD  ARIPiprazole (ABILIFY) 10 MG tablet Take 10 mg by mouth daily.    Historical Provider, MD  budesonide-formoterol (SYMBICORT) 160-4.5 MCG/ACT inhaler Inhale 2 puffs into the lungs 2 (two) times daily.    Historical Provider, MD  HYDROcodone-homatropine  (HYCODAN) 5-1.5 MG/5ML syrup Take 5 mLs by mouth every 6 (six) hours as needed for cough. 09/01/14   Hanna Patel-Mills, PA-C  ibuprofen (ADVIL,MOTRIN) 800 MG tablet Take 1 tablet (800 mg total) by mouth 3 (three) times daily. Patient not taking: Reported on 09/01/2014 04/26/14   Elpidio Anis, PA-C  traZODone (DESYREL) 50 MG tablet Take 50 mg by mouth at bedtime.    Historical Provider, MD  Vortioxetine HBr (TRINTELLIX) 10 MG TABS Take 1 tablet by mouth every morning.    Historical Provider, MD   BP 122/78 mmHg  Pulse 64  Temp(Src) 97.9 F (36.6 C) (Oral)  Resp 18  Wt 178 lb (80.74 kg)  SpO2 99% Physical Exam  Constitutional: He is oriented to person, place, and time. He appears well-developed and well-nourished. No distress.  Eyes: Conjunctivae are normal.  Cardiovascular: Normal rate, regular rhythm and normal heart sounds.   Pulmonary/Chest: Effort normal and breath sounds normal. No respiratory distress. He has no wheezes. He has no rales.  Genitourinary:  Yellow penile discharge noted. No skin lesions. Scrotum is normal bilaterally with no swelling or tenderness.  Neurological: He is alert and oriented to person, place, and time.  Skin: Skin is warm and dry.  Nursing note and vitals reviewed.   ED Course  Procedures (including critical care time) Labs Review Labs Reviewed  RPR  HIV ANTIBODY (ROUTINE TESTING)  GC/CHLAMYDIA PROBE AMP ([place]) NOT AT [facility]    Imaging Review No results found.   EKG Interpretation None      MDM   Final diagnoses:  STD (male)   Patient request for STD check. Will get RPR and HIV.Gonorrhea Chlamydia swab was sent. Protrusion emergency department with Rocephin 250 mg IM, Zithromax 1 g by mouth, Flagyl 2 g by mouth. Discussed safe sex practices and protection used each time. Discharge home with help department follow-up.  Filed Vitals:   09/11/14 1055 09/11/14 1204  BP: 122/78   Pulse: 64 76  Temp: 97.9 F (36.6 C)   TempSrc: Oral    Resp: 18   Weight: 178 lb (80.74 kg)   SpO2: 99% 100%     Jaynie Crumble, PA-C 09/11/14 1528  Mirian Mo, MD 09/12/14 1044

## 2014-09-11 NOTE — Discharge Instructions (Signed)
No intercourse for a week. Make sure partner is treated as well. Follow up with health dept.    Sexually Transmitted Disease A sexually transmitted disease (STD) is a disease or infection that may be passed (transmitted) from person to person, usually during sexual activity. This may happen by way of saliva, semen, blood, vaginal mucus, or urine. Common STDs include:   Gonorrhea.   Chlamydia.   Syphilis.   HIV and AIDS.   Genital herpes.   Hepatitis B and C.   Trichomonas.   Human papillomavirus (HPV).   Pubic lice.   Scabies.  Mites.  Bacterial vaginosis. WHAT ARE CAUSES OF STDs? An STD may be caused by bacteria, a virus, or parasites. STDs are often transmitted during sexual activity if one person is infected. However, they may also be transmitted through nonsexual means. STDs may be transmitted after:   Sexual intercourse with an infected person.   Sharing sex toys with an infected person.   Sharing needles with an infected person or using unclean piercing or tattoo needles.  Having intimate contact with the genitals, mouth, or rectal areas of an infected person.   Exposure to infected fluids during birth. WHAT ARE THE SIGNS AND SYMPTOMS OF STDs? Different STDs have different symptoms. Some people may not have any symptoms. If symptoms are present, they may include:   Painful or bloody urination.   Pain in the pelvis, abdomen, vagina, anus, throat, or eyes.   A skin rash, itching, or irritation.  Growths, ulcerations, blisters, or sores in the genital and anal areas.  Abnormal vaginal discharge with or without bad odor.   Penile discharge in men.   Fever.   Pain or bleeding during sexual intercourse.   Swollen glands in the groin area.   Yellow skin and eyes (jaundice). This is seen with hepatitis.   Swollen testicles.  Infertility.  Sores and blisters in the mouth. HOW ARE STDs DIAGNOSED? To make a diagnosis, your health  care provider may:   Take a medical history.   Perform a physical exam.   Take a sample of any discharge to examine.  Swab the throat, cervix, opening to the penis, rectum, or vagina for testing.  Test a sample of your first morning urine.   Perform blood tests.   Perform a Pap test, if this applies.   Perform a colposcopy.   Perform a laparoscopy.  HOW ARE STDs TREATED? Treatment depends on the STD. Some STDs may be treated but not cured.   Chlamydia, gonorrhea, trichomonas, and syphilis can be cured with antibiotic medicine.   Genital herpes, hepatitis, and HIV can be treated, but not cured, with prescribed medicines. The medicines lessen symptoms.   Genital warts from HPV can be treated with medicine or by freezing, burning (electrocautery), or surgery. Warts may come back.   HPV cannot be cured with medicine or surgery. However, abnormal areas may be removed from the cervix, vagina, or vulva.   If your diagnosis is confirmed, your recent sexual partners need treatment. This is true even if they are symptom-free or have a negative culture or evaluation. They should not have sex until their health care providers say it is okay. HOW CAN I REDUCE MY RISK OF GETTING AN STD? Take these steps to reduce your risk of getting an STD:  Use latex condoms, dental dams, and water-soluble lubricants during sexual activity. Do not use petroleum jelly or oils.  Avoid having multiple sex partners.  Do not have sex with someone  who has other sex partners.  Do not have sex with anyone you do not know or who is at high risk for an STD.  Avoid risky sex practices that can break your skin.  Do not have sex if you have open sores on your mouth or skin.  Avoid drinking too much alcohol or taking illegal drugs. Alcohol and drugs can affect your judgment and put you in a vulnerable position.  Avoid engaging in oral and anal sex acts.  Get vaccinated for HPV and hepatitis. If  you have not received these vaccines in the past, talk to your health care provider about whether one or both might be right for you.   If you are at risk of being infected with HIV, it is recommended that you take a prescription medicine daily to prevent HIV infection. This is called pre-exposure prophylaxis (PrEP). You are considered at risk if:  You are a man who has sex with other men (MSM).  You are a heterosexual man or woman and are sexually active with more than one partner.  You take drugs by injection.  You are sexually active with a partner who has HIV.  Talk with your health care provider about whether you are at high risk of being infected with HIV. If you choose to begin PrEP, you should first be tested for HIV. You should then be tested every 3 months for as long as you are taking PrEP.  WHAT SHOULD I DO IF I THINK I HAVE AN STD?  See your health care provider.   Tell your sexual partner(s). They should be tested and treated for any STDs.  Do not have sex until your health care provider says it is okay. WHEN SHOULD I GET IMMEDIATE MEDICAL CARE? Contact your health care provider right away if:   You have severe abdominal pain.  You are a man and notice swelling or pain in your testicles.  You are a woman and notice swelling or pain in your vagina. Document Released: 04/16/2002 Document Revised: 01/29/2013 Document Reviewed: 08/14/2012 [facility] Patient Information 2015 [place], [place]. This information is not intended to replace advice given to you by your health care provider. Make sure you discuss any questions you have with your health care provider.

## 2014-09-12 LAB — GC/CHLAMYDIA PROBE AMP (~~LOC~~) NOT AT ARMC
CHLAMYDIA, DNA PROBE: POSITIVE — AB
Neisseria Gonorrhea: POSITIVE — AB

## 2014-09-12 LAB — HIV ANTIBODY (ROUTINE TESTING W REFLEX): HIV SCREEN 4TH GENERATION: NONREACTIVE

## 2014-09-12 LAB — RPR: RPR Ser Ql: NONREACTIVE

## 2014-09-15 ENCOUNTER — Telehealth (HOSPITAL_COMMUNITY): Payer: Self-pay

## 2014-09-15 NOTE — Telephone Encounter (Signed)
Positive for gonorrhea and chlamydia. Treated per protocol. DHHS form completed and faxed. Attempting to contact

## 2014-09-17 ENCOUNTER — Telehealth (HOSPITAL_COMMUNITY): Payer: Self-pay

## 2014-09-17 NOTE — Telephone Encounter (Signed)
09/17/14 @ 14:39 Left message for pt to return call.

## 2014-09-19 ENCOUNTER — Telehealth (HOSPITAL_COMMUNITY): Payer: Self-pay

## 2014-09-19 NOTE — Telephone Encounter (Signed)
LVM requesting callback.  Unable to reach by phone x 3, letter sent to [facility] address.

## 2014-09-29 ENCOUNTER — Telehealth (HOSPITAL_COMMUNITY): Payer: Self-pay

## 2014-09-29 NOTE — Telephone Encounter (Signed)
Unable to contact pt by mail or telephone. Unable to communicate lab results or treatment changes. 

## 2017-03-13 IMAGING — CR DG CHEST 2V
2 series · 2 of 2 positions shown · non-contrast
Comparison: 04/03/2013

CLINICAL DATA: Chest pain and bloody sputum

EXAM:
CHEST - 2 VIEW

[chest pa]
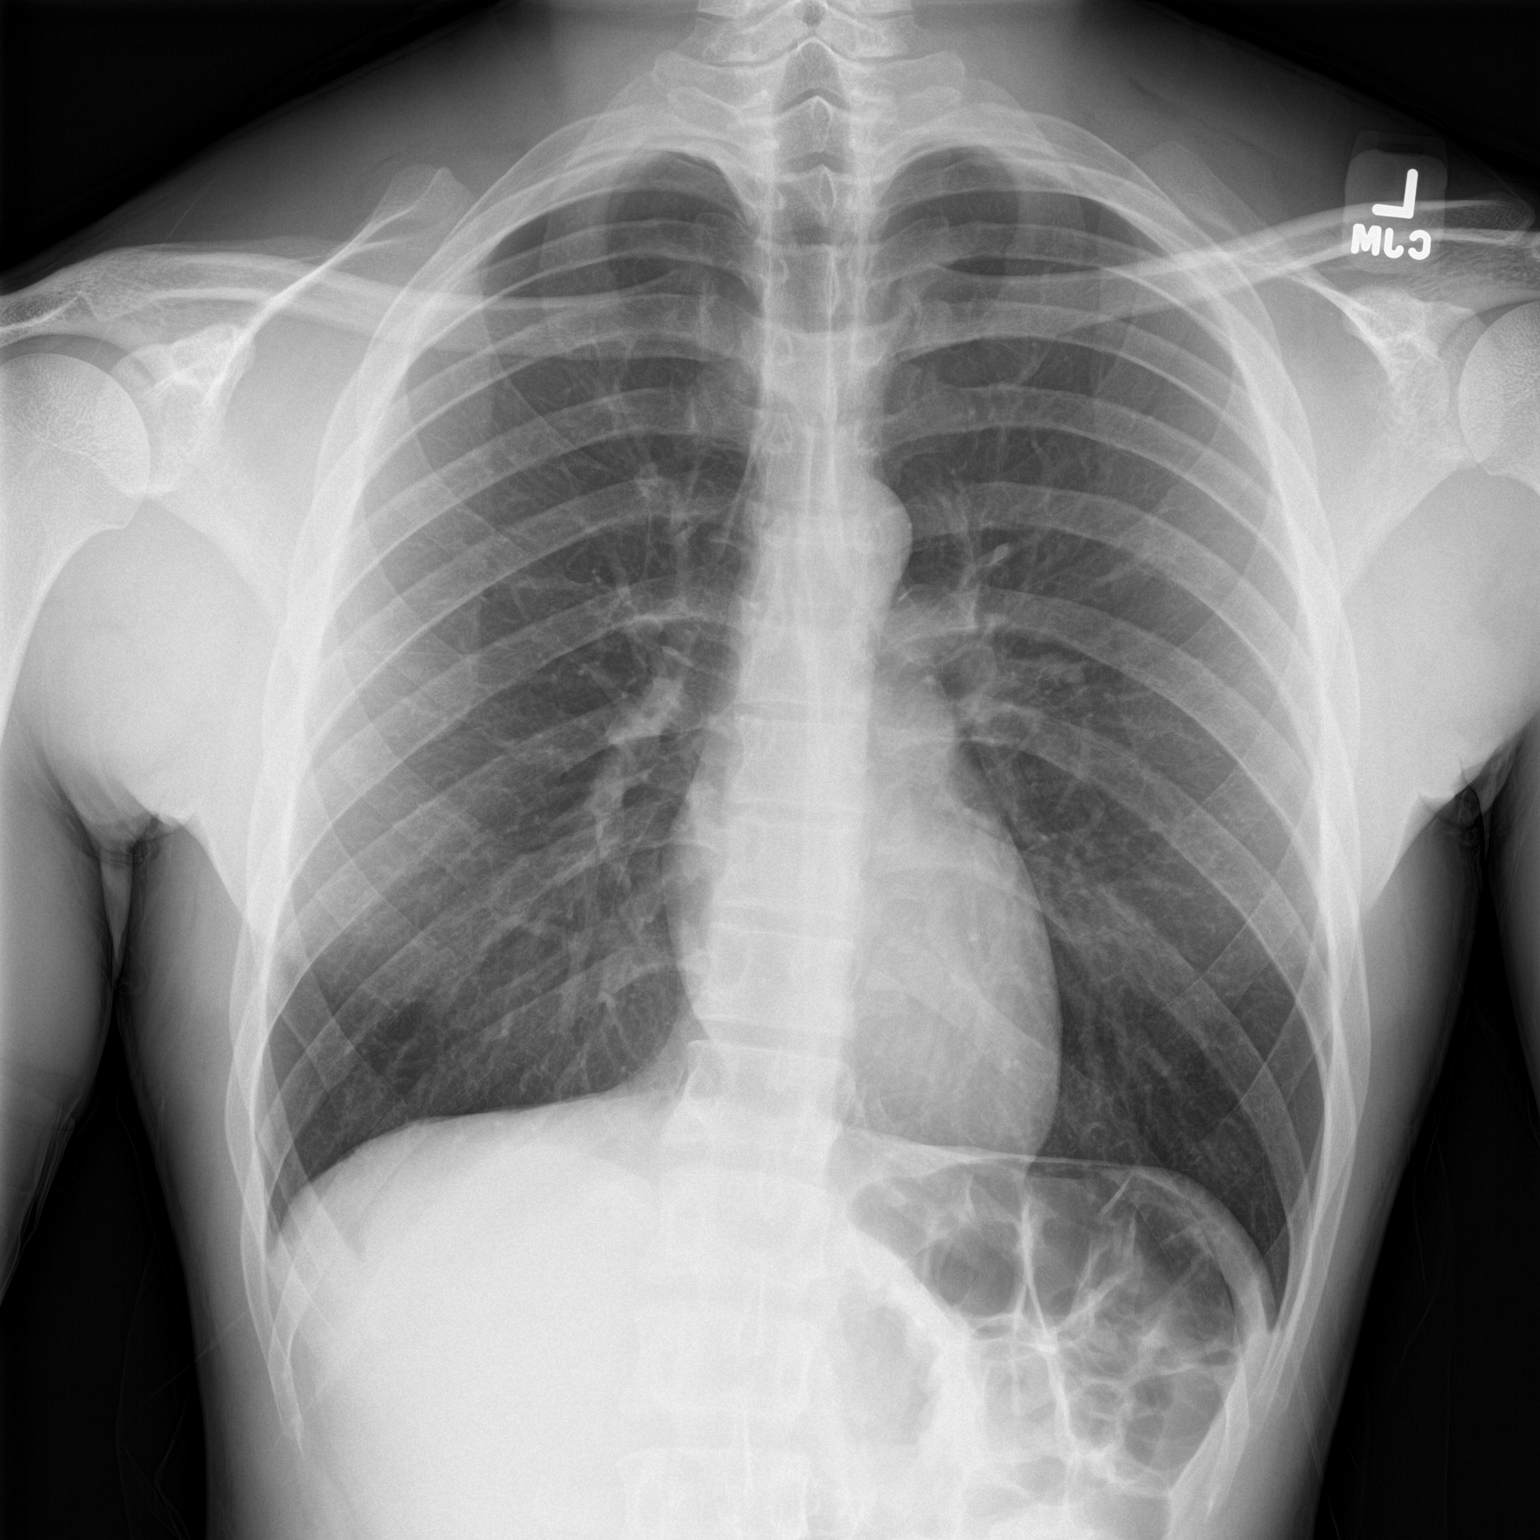

[chest lat]
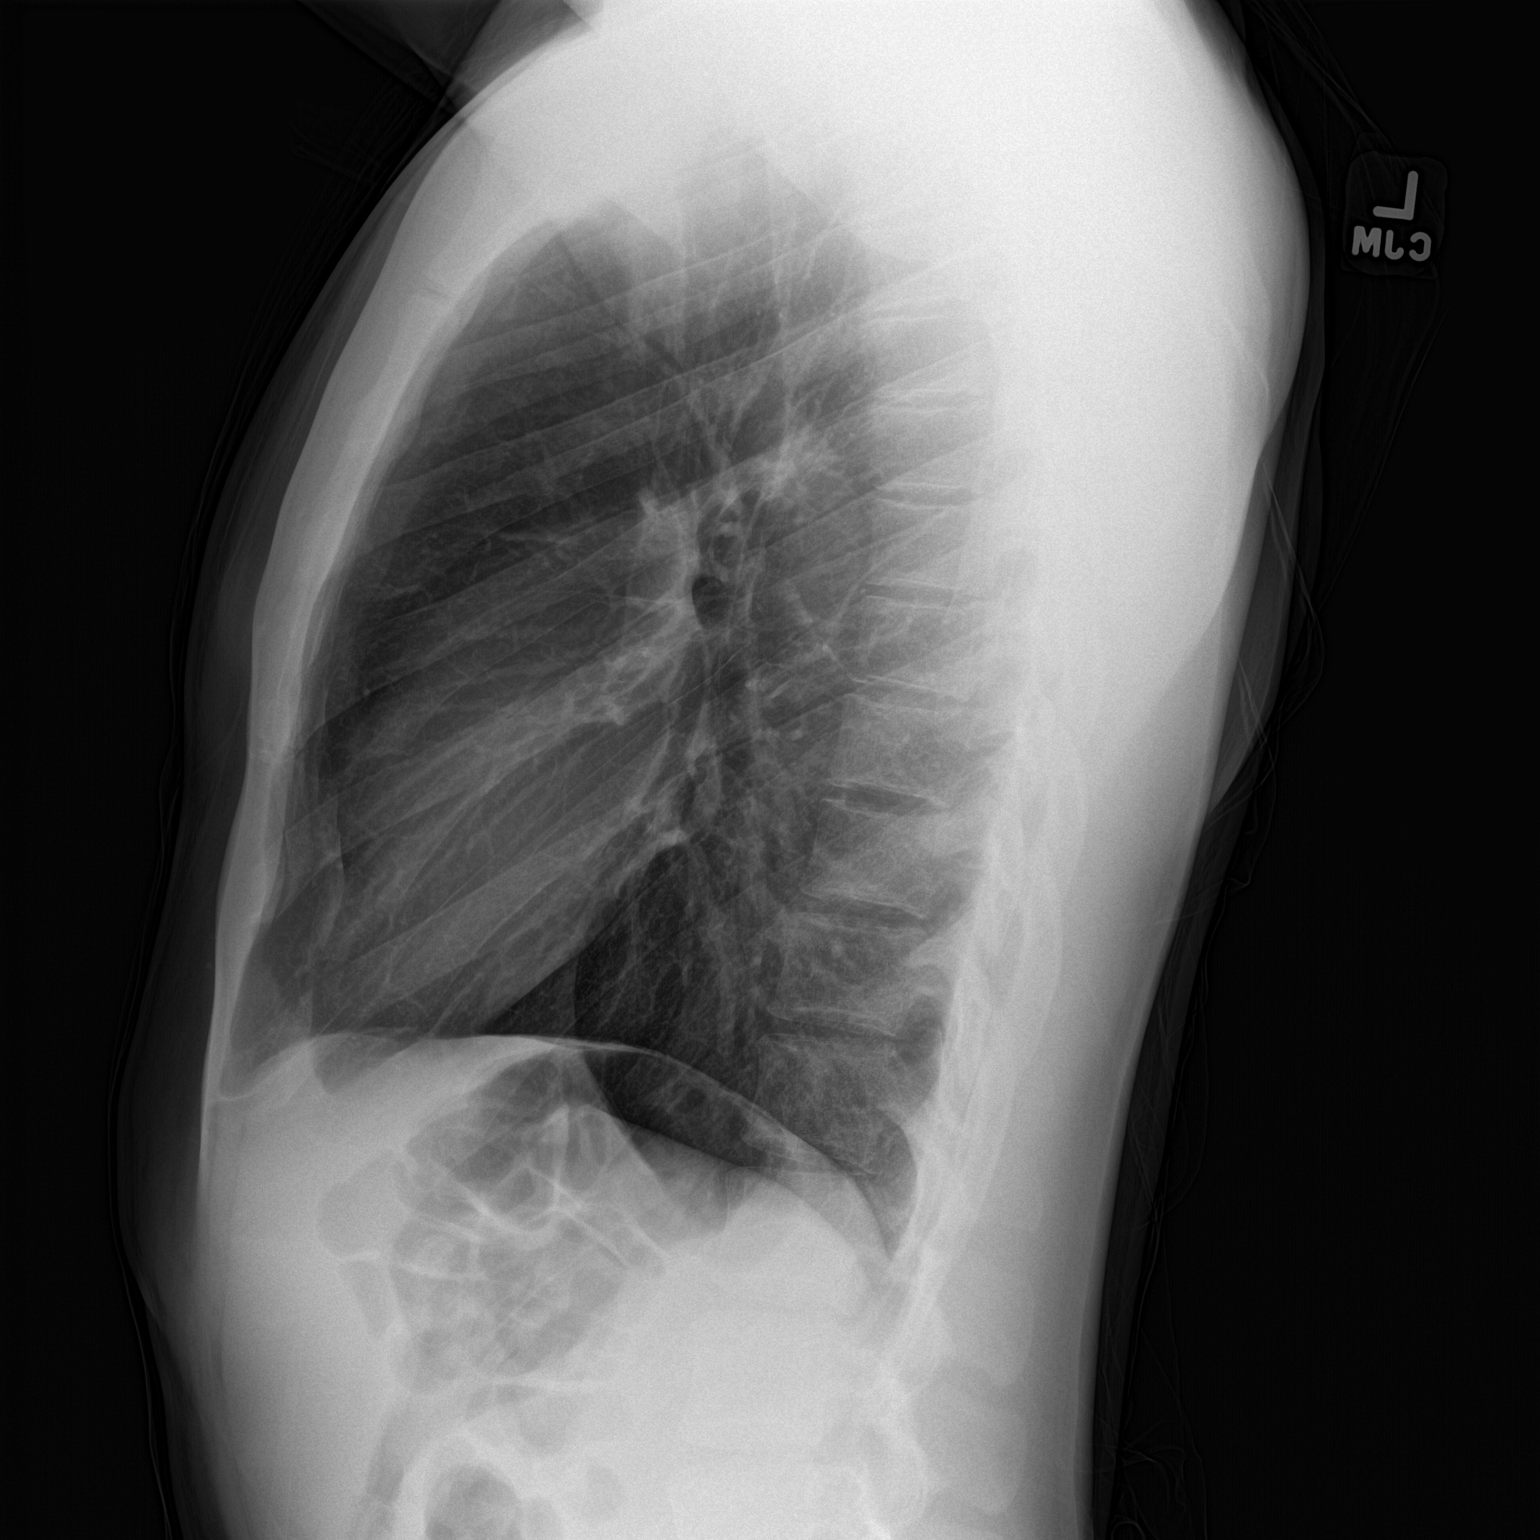

[2 of 2 positions shown; findings below may reference images not displayed]

FINDINGS: The heart size and mediastinal contours are within normal limits.
Both lungs are clear. The visualized skeletal structures are
unremarkable.
IMPRESSION: No active disease.

## 2022-03-06 ENCOUNTER — Emergency Department (HOSPITAL_COMMUNITY)
Admission: EM | Admit: 2022-03-06 | Discharge: 2022-03-06 | Disposition: A | Discharge status: Home / Self Care | Payer: Self-pay | Attending: Emergency Medicine | Admitting: Emergency Medicine

## 2022-03-06 ENCOUNTER — Emergency Department (HOSPITAL_COMMUNITY): Payer: Self-pay

## 2022-03-06 ENCOUNTER — Other Ambulatory Visit: Payer: Self-pay

## 2022-03-06 DIAGNOSIS — W228XXA Striking against or struck by other objects, initial encounter: Secondary | ICD-10-CM | POA: Insufficient documentation

## 2022-03-06 DIAGNOSIS — S0101XA Laceration without foreign body of scalp, initial encounter: Secondary | ICD-10-CM

## 2022-03-06 DIAGNOSIS — Z23 Encounter for immunization: Secondary | ICD-10-CM | POA: Insufficient documentation

## 2022-03-06 MED ORDER — TETANUS-DIPHTH-ACELL PERTUSSIS 5-2.5-18.5 LF-MCG/0.5 IM SUSY
0.5000 mL | PREFILLED_SYRINGE | Freq: Once | INTRAMUSCULAR | Status: AC
  Administered 2022-03-06: 0.5 mL via INTRAMUSCULAR
  Filled 2022-03-06: qty 0.5
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
## 2022-03-06 NOTE — Discharge Instructions (Signed)
Your Tdap was updated today.  Please follow-up with the family medicine center if attached to your discharge papers to have your staples removed in 7 to 10 days or you may go to an urgent care to have them removed.  If the laceration becomes warm, draining pus, or you feel confused or having fevers please return to ER.
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
## 2022-03-28 ENCOUNTER — Encounter (HOSPITAL_COMMUNITY): Payer: Self-pay

## 2022-03-28 ENCOUNTER — Ambulatory Visit (HOSPITAL_COMMUNITY)
Admission: EM | Admit: 2022-03-28 | Discharge: 2022-03-28 | Disposition: A | Discharge status: Home / Self Care | Payer: Self-pay

## 2022-03-28 DIAGNOSIS — S0101XD Laceration without foreign body of scalp, subsequent encounter: Secondary | ICD-10-CM

## 2022-03-28 DIAGNOSIS — Z4802 Encounter for removal of sutures: Secondary | ICD-10-CM

## 2022-03-28 NOTE — ED Provider Notes (Signed)
[place]    CSN: [id number] Arrival date & time: 03/28/22  1526      History   Chief Complaint Chief Complaint  Patient presents with   Suture / Staple Removal    HPI Derek Davis is a 32 y.o. male.  Here for staple removal Was seen in ED 1/28 and had 2 staples placed in head Was instructed to return in 7-10 days for removal. He is here 3 weeks after placement  No pain  Past Medical History:  Diagnosis Date   Asthma     There are no problems to display for this patient.   Past Surgical History:  Procedure Laterality Date   APPENDECTOMY         Home Medications    Prior to Admission medications   Medication Sig Start Date End Date Taking? Authorizing Provider  albuterol (PROVENTIL HFA;VENTOLIN HFA) 108 (90 BASE) MCG/ACT inhaler Inhale 2 puffs into the lungs every 6 (six) hours as needed for wheezing or shortness of breath (wheezing).     [provider]  ARIPiprazole (ABILIFY) 10 MG tablet Take 10 mg by mouth daily.    [provider]  budesonide-formoterol (SYMBICORT) 160-4.5 MCG/ACT inhaler Inhale 2 puffs into the lungs 2 (two) times daily.    [provider]  ibuprofen (ADVIL,MOTRIN) 800 MG tablet Take 1 tablet (800 mg total) by mouth 3 (three) times daily. Patient not taking: Reported on 09/01/2014 04/26/14   Charlann Lange, PA-C    Family History Family History  Problem Relation Age of Onset   Hypertension Mother     Social History Social History   Tobacco Use   Smoking status: Every Day    Packs/day: 0.20    Types: Cigarettes   Smokeless tobacco: Never  Vaping Use   Vaping Use: Never used  Substance Use Topics   Alcohol use: Yes    Comment: occ   Drug use: No     Allergies   Patient has no known allergies.   Review of Systems Review of Systems  As per HPI  Physical Exam Triage Vital Signs ED Triage Vitals  Enc Vitals Group     BP      Pulse      Resp      Temp      Temp src       SpO2      Weight      Height      Head Circumference      Peak Flow      Pain Score      Pain Loc      Pain Edu?      Excl. in [place]?    No data found.  Updated Vital Signs BP 138/86 (BP Location: Left Arm)   Pulse 62   Temp 98.2 F (36.8 C) (Oral)   Resp 16   SpO2 97%   Physical Exam Vitals and nursing note reviewed.  Constitutional:      General: He is not in acute distress. HENT:     Head:     Comments: Two staples at crown of head. No crusting, bleeding, swelling Neurological:     Mental Status: He is alert and oriented to person, place, and time.     UC Treatments / Results  Labs (all labs ordered are listed, but only abnormal results are displayed) Labs Reviewed - No data to display  EKG  Radiology No results found.  Procedures Procedures   Medications Ordered in  UC Medications - No data to display  Initial Impression / Assessment and Plan / UC Course  I have reviewed the triage vital signs and the nursing notes.  Pertinent labs & imaging results that were available during my care of the patient were reviewed by me and considered in my medical decision making (see chart for details).  2 staples removed from head by this provider Healed well Discussed return precautions  Final Clinical Impressions(s) / UC Diagnoses   Final diagnoses:  Encounter for staple removal     Discharge Instructions      Healing well. Please return with any concerns      ED Prescriptions   None    PDMP not reviewed this encounter.   Danijah Noh, [place] Guiles, [place] 03/28/22 1704

## 2022-03-28 NOTE — ED Triage Notes (Signed)
Patient states he had staples placed on his scalp on 03/06/22.

## 2022-03-28 NOTE — Discharge Instructions (Addendum)
Healing well. Please return with any concerns

## 2022-06-04 ENCOUNTER — Emergency Department (HOSPITAL_COMMUNITY)
Admission: EM | Admit: 2022-06-04 | Discharge: 2022-06-05 | Discharge status: Against Medical Advice | Payer: Self-pay | Attending: Emergency Medicine | Admitting: Emergency Medicine

## 2022-06-04 ENCOUNTER — Encounter (HOSPITAL_COMMUNITY): Payer: Self-pay | Admitting: Emergency Medicine

## 2022-06-04 DIAGNOSIS — Z5321 Procedure and treatment not carried out due to patient leaving prior to being seen by health care provider: Secondary | ICD-10-CM | POA: Insufficient documentation

## 2022-06-04 DIAGNOSIS — S01112A Laceration without foreign body of left eyelid and periocular area, initial encounter: Secondary | ICD-10-CM | POA: Insufficient documentation
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
## 2022-06-04 NOTE — ED Notes (Signed)
CT called to get pt when they can.

## 2022-06-05 ENCOUNTER — Emergency Department (HOSPITAL_COMMUNITY): Payer: Self-pay

## 2022-06-05 NOTE — ED Notes (Signed)
NA for CT

## 2022-06-08 ENCOUNTER — Telehealth: Payer: Self-pay | Admitting: Family Medicine

## 2022-06-08 ENCOUNTER — Ambulatory Visit: Payer: Self-pay

## 2022-06-08 DIAGNOSIS — L237 Allergic contact dermatitis due to plants, except food: Secondary | ICD-10-CM

## 2022-06-08 MED ORDER — PREDNISONE 10 MG PO TABS: ORAL_TABLET | ORAL | 0 refills | Status: AC

## 2022-06-08 MED ORDER — TRIAMCINOLONE ACETONIDE 0.025 % EX OINT: 1.0000 | TOPICAL_OINTMENT | Freq: Two times a day (BID) | CUTANEOUS | 0 refills | Status: AC

## 2022-06-08 NOTE — Patient Instructions (Signed)
[place], thank you for joining Freddy Finner, NP for today's virtual visit.  While this provider is not your primary care provider (PCP), if your PCP is located in our provider database this encounter information will be shared with them immediately following your visit.   A [facility] MyChart account gives you access to today's visit and all your visits, tests, and labs performed at [facility] " click here if you don't have a [facility] MyChart account or go to [email or web address]  Consent: (Patient) Derek Davis provided verbal consent for this virtual visit at the beginning of the encounter.  Current Medications:  Current Outpatient Medications:    predniSONE (DELTASONE) 10 MG tablet, Days 1-4 take 4 tablets (40 mg) daily  Days 5-8 take 3 tablets (30 mg) daily, Days 9-11 take 2 tablets (20 mg) daily, Days 12-14 take 1 tablet (10 mg) daily., Disp: 37 tablet, Rfl: 0   triamcinolone (KENALOG) 0.025 % ointment, Apply 1 Application topically 2 (two) times daily., Disp: 30 g, Rfl: 0   albuterol (PROVENTIL HFA;VENTOLIN HFA) 108 (90 BASE) MCG/ACT inhaler, Inhale 2 puffs into the lungs every 6 (six) hours as needed for wheezing or shortness of breath (wheezing). , Disp: , Rfl:    ARIPiprazole (ABILIFY) 10 MG tablet, Take 10 mg by mouth daily., Disp: , Rfl:    budesonide-formoterol (SYMBICORT) 160-4.5 MCG/ACT inhaler, Inhale 2 puffs into the lungs 2 (two) times daily., Disp: , Rfl:    ibuprofen (ADVIL,MOTRIN) 800 MG tablet, Take 1 tablet (800 mg total) by mouth 3 (three) times daily. (Patient not taking: Reported on 09/01/2014), Disp: 21 tablet, Rfl: 0   Medications ordered in this encounter:  Meds ordered this encounter  Medications   predniSONE (DELTASONE) 10 MG tablet    Sig: Days 1-4 take 4 tablets (40 mg) daily  Days 5-8 take 3 tablets (30 mg) daily, Days 9-11 take 2 tablets (20 mg) daily, Days 12-14 take 1 tablet (10 mg) daily.    Dispense:  37 tablet    Refill:  0     Order Specific Question:   Supervising Provider    Answer:   Merrilee Jansky [id number]   triamcinolone (KENALOG) 0.025 % ointment    Sig: Apply 1 Application topically 2 (two) times daily.    Dispense:  30 g    Refill:  0    Order Specific Question:   Supervising Provider    Answer:   Merrilee Jansky [id number]     *If you need refills on other medications prior to your next appointment, please contact your pharmacy*  Follow-Up: Call back or seek an in-person evaluation if the symptoms worsen or if the condition fails to improve as anticipated.  [facility] [phone]  Other Instructions   This blistering rash is often called poison ivy rash although it can come from contact with the leaves, stems and roots of poison ivy, poison oak and poison sumac.  The oily resin contains urushiol (u-ROO-she-ol) that produces a skin rash on exposed skin.  The severity of the rash depends on the amount of urushiol that gets on your skin.  A section of skin with more urushiol on it may develop a rash sooner.  The rash usually develops 12-48 hours after exposure and can last two to three weeks.  Your skin must come in direct contact with the plant's oil to be affected.  Blister fluid doesn't spread the rash.  However, if you come into contact with  a piece of clothing or pet fur that has urushiol on it, the rash may spread out.  You can also transfer the oil to other parts of your body with your fingers.  What can you do to prevent this rash?  Avoid the plants.  Learn how to identify poison ivy, poison oak and poison sumac in all seasons.  When hiking or engaging in other activities that might expose you to these plants, try to stay on cleared pathways.  If camping, make sure you pitch your tent in an area free of these plants.  Keep pets from running through wooded areas so that urushiol doesn't accidentally stick to their fur, which you may touch.  Remove or kill the plants.  In  your yard, you can get rid of poison ivy by applying an herbicide or pulling it out of the ground, including the roots, while wearing heavy gloves.  Afterward remove the gloves and thoroughly wash them and your hands.  Don't burn poison ivy or related plants because the urushiol can be carried by smoke.  Wear protective clothing.  If needed, protect your skin by wearing socks, boots, pants, long sleeves and vinyl gloves.  Wash your skin right away.  Washing off the oil with soap and water within 30 minutes of exposure may reduce your chances of getting a poison ivy rash.  Even washing after an hour or so can help reduce the severity of the rash.  If you walk through some poison ivy and then later touch your shoes, you may get some urushiol on your hands, which may then transfer to your face or body by touching or rubbing.  If the contaminated object isn't cleaned, the urushiol on it can still cause a skin reaction years later.    Be careful not to reuse towels after you have washed your skin.  Also carefully wash clothing in detergent and hot water to remove all traces of the oil.  Handle contaminated clothing carefully so you don't transfer the urushiol to yourself, furniture, rugs or appliances.  Remember that pets can carry the oil on their fur and paws.  If you think your pet may be contaminated with urushiol, put on some long rubber gloves and give your pet a bath.  Finally, be careful not to burn these plants as the smoke can contain traces of the oil.  Inhaling the smoke may result in difficulty breathing. If that occurred you should see a physician as soon as possible.  See your doctor right away if:  The reaction is severe or widespread You inhaled the smoke from burning poison ivy and are having difficulty breathing Your skin continues to swell The rash affects your eyes, mouth or genitals Blisters are oozing pus You develop a fever greater than 100 F (37.8 C) The rash doesn't get  better within a few weeks.  If you scratch the poison ivy rash, bacteria under your fingernails may cause the skin to become infected.  See your doctor if pus starts oozing from the blisters.  Treatment generally includes antibiotics.  Poison ivy treatments are usually limited to self-care methods.  And the rash typically goes away on its own in two to three weeks.     If the rash is widespread or results in a large number of blisters, your doctor may prescribe an oral corticosteroid, such as prednisone.  If a bacterial infection has developed at the rash site, your doctor may give you a prescription for an oral antibiotic.  MAKE SURE YOU  Understand these instructions. Will watch your condition. Will get help right away if you are not doing well or get worse.    If you have been instructed to have an in-person evaluation today at a local Urgent Care facility, please use the link below. It will take you to a list of all of our available [facility], including address, phone number and hours of operation. Please do not delay care.  [facility]  If you or a family member do not have a primary care provider, use the link below to schedule a visit and establish care. When you choose a [facility] primary care physician or advanced practice provider, you gain a long-term partner in health. Find a Primary Care Provider  Learn more about [facility]'s in-office and virtual care options: [facility] - Get Care Now

## 2022-06-08 NOTE — Progress Notes (Signed)
Virtual Visit Consent   Derek Davis, you are scheduled for a virtual visit with a [facility] provider today. Just as with appointments in the office, your consent must be obtained to participate. Your consent will be active for this visit and any virtual visit you may have with one of our providers in the next 365 days. If you have a MyChart account, a copy of this consent can be sent to you electronically.  As this is a virtual visit, video technology does not allow for your provider to perform a traditional examination. This may limit your provider's ability to fully assess your condition. If your provider identifies any concerns that need to be evaluated in person or the need to arrange testing (such as labs, EKG, etc.), we will make arrangements to do so. Although advances in technology are sophisticated, we cannot ensure that it will always work on either your end or our end. If the connection with a video visit is poor, the visit may have to be switched to a telephone visit. With either a video or telephone visit, we are not always able to ensure that we have a secure connection.  By engaging in this virtual visit, you consent to the provision of healthcare and authorize for your insurance to be billed (if applicable) for the services provided during this visit. Depending on your insurance coverage, you may receive a charge related to this service.  I need to obtain your verbal consent now. Are you willing to proceed with your visit today? Jayce Kainz has provided verbal consent on 06/08/2022 for a virtual visit (video or telephone). Freddy Finner, NP  Date: 06/08/2022 3:22 PM  Virtual Visit via Video Note   I, Freddy Finner, connected with  Garritt Molyneux  ([id number], [birth year]) on 06/08/22 at  3:15 PM EDT by a video-enabled telemedicine application and verified that I am speaking with the correct person using two identifiers.  Location: Patient: Virtual Visit Location Patient:  Home Provider: Virtual Visit Location Provider: Home Office   I discussed the limitations of evaluation and management by telemedicine and the availability of in person appointments. The patient expressed understanding and agreed to proceed.    History of Present Illness: Derek Davis is a 32 y.o. who identifies as a male who was assigned male at birth, and is being seen today for rash  Onset was bumpy rash post mowing yard two days ago- arms, legs, trunk, head, and back Associated symptoms are redness, itching, blister like Modifying factors are calamine lotion Denies chest pain, shortness of breath, fevers, chills   Problems: There are no problems to display for this patient.   Allergies: No Known Allergies Medications:  Current Outpatient Medications:    albuterol (PROVENTIL HFA;VENTOLIN HFA) 108 (90 BASE) MCG/ACT inhaler, Inhale 2 puffs into the lungs every 6 (six) hours as needed for wheezing or shortness of breath (wheezing). , Disp: , Rfl:    ARIPiprazole (ABILIFY) 10 MG tablet, Take 10 mg by mouth daily., Disp: , Rfl:    budesonide-formoterol (SYMBICORT) 160-4.5 MCG/ACT inhaler, Inhale 2 puffs into the lungs 2 (two) times daily., Disp: , Rfl:    ibuprofen (ADVIL,MOTRIN) 800 MG tablet, Take 1 tablet (800 mg total) by mouth 3 (three) times daily. (Patient not taking: Reported on 09/01/2014), Disp: 21 tablet, Rfl: 0  Observations/Objective: Patient is well-developed, well-nourished in no acute distress.  Resting comfortably  at home.  Head is normocephalic, atraumatic.  No labored breathing.  Speech is clear and coherent  with logical content.  Patient is alert and oriented at baseline.  Noted rash red and bumpy and blister on arms, legs  Assessment and Plan:  1. Contact dermatitis due to poison vine  - predniSONE (DELTASONE) 10 MG tablet; Days 1-4 take 4 tablets (40 mg) daily  Days 5-8 take 3 tablets (30 mg) daily, Days 9-11 take 2 tablets (20 mg) daily, Days 12-14 take 1  tablet (10 mg) daily.  Dispense: 37 tablet; Refill: 0 - triamcinolone (KENALOG) 0.025 % ointment; Apply 1 Application topically 2 (two) times daily.  Dispense: 30 g; Refill: 0   -rash suspect based on outside work- that could be related to poison vine of some kind. -due to nature of it covering several areas - pred course given -details of care discussed -in person precautions advised   Reviewed side effects, risks and benefits of medication.    Patient acknowledged agreement and understanding of the plan.   Past Medical, Surgical, Social History, Allergies, and Medications have been Reviewed.     Follow Up Instructions: I discussed the assessment and treatment plan with the patient. The patient was provided an opportunity to ask questions and all were answered. The patient agreed with the plan and demonstrated an understanding of the instructions.  A copy of instructions were sent to the patient via MyChart unless otherwise noted below.    The patient was advised to call back or seek an in-person evaluation if the symptoms worsen or if the condition fails to improve as anticipated.  Time:  I spent 10 minutes with the patient via telehealth technology discussing the above problems/concerns.    Freddy Finner, NP

## 2022-06-10 ENCOUNTER — Ambulatory Visit (HOSPITAL_COMMUNITY)
Admission: EM | Admit: 2022-06-10 | Discharge: 2022-06-10 | Disposition: A | Discharge status: Home / Self Care | Payer: Self-pay | Attending: Urgent Care | Admitting: Urgent Care

## 2022-06-10 ENCOUNTER — Encounter (HOSPITAL_COMMUNITY): Payer: Self-pay | Admitting: Emergency Medicine

## 2022-06-10 DIAGNOSIS — L247 Irritant contact dermatitis due to plants, except food: Secondary | ICD-10-CM

## 2022-06-10 MED ORDER — METHYLPREDNISOLONE SODIUM SUCC 125 MG IJ SOLR
125.0000 mg | Freq: Once | INTRAMUSCULAR | Status: AC
  Administered 2022-06-10: 125 mg via INTRAVENOUS

## 2022-06-10 MED ORDER — METHYLPREDNISOLONE SODIUM SUCC 125 MG IJ SOLR
INTRAMUSCULAR | Status: AC
  Filled 2022-06-10: qty 2

## 2022-06-10 NOTE — ED Triage Notes (Signed)
Reports rash all over for 2-3 days. Reports itching and tried something with Benadryl and calamine lotion.  Telehealth visit yesterday that prescribed prednisone and kenalog ointment

## 2022-06-10 NOTE — ED Provider Notes (Addendum)
Derek [facility] - [facility]   MRN: [id number] DOB: Jul 13, 1990  Subjective:   Derek Davis is a 32 y.o. male presenting for 3-day history of acute onset persistent itchy rash over the arms and legs.  Patient had a video visit and was prescribed prednisone both orally and topically.  Wanted to come in and be evaluated in person.  Symptoms started after he was doing a lot of yard work, had to clear a lot of vines and brush.  Does not know what he was handling.  No other family members at home have his rash.  No facial or oral swelling, tightness, nausea, vomiting.  No current facility-administered medications for this encounter.  Current Outpatient Medications:    albuterol (PROVENTIL HFA;VENTOLIN HFA) 108 (90 BASE) MCG/ACT inhaler, Inhale 2 puffs into the lungs every 6 (six) hours as needed for wheezing or shortness of breath (wheezing). , Disp: , Rfl:    ARIPiprazole (ABILIFY) 10 MG tablet, Take 10 mg by mouth daily., Disp: , Rfl:    budesonide-formoterol (SYMBICORT) 160-4.5 MCG/ACT inhaler, Inhale 2 puffs into the lungs 2 (two) times daily., Disp: , Rfl:    ibuprofen (ADVIL,MOTRIN) 800 MG tablet, Take 1 tablet (800 mg total) by mouth 3 (three) times daily. (Patient not taking: Reported on 09/01/2014), Disp: 21 tablet, Rfl: 0   predniSONE (DELTASONE) 10 MG tablet, Days 1-4 take 4 tablets (40 mg) daily  Days 5-8 take 3 tablets (30 mg) daily, Days 9-11 take 2 tablets (20 mg) daily, Days 12-14 take 1 tablet (10 mg) daily., Disp: 37 tablet, Rfl: 0   triamcinolone (KENALOG) 0.025 % ointment, Apply 1 Application topically 2 (two) times daily., Disp: 30 g, Rfl: 0   No Known Allergies  Past Medical History:  Diagnosis Date   Asthma      Past Surgical History:  Procedure Laterality Date   APPENDECTOMY      Family History  Problem Relation Age of Onset   Hypertension Mother     Social History   Tobacco Use   Smoking status: Every Day    Packs/day: .2    Types: Cigarettes    Smokeless tobacco: Never  Vaping Use   Vaping Use: Never used  Substance Use Topics   Alcohol use: Yes    Comment: occ   Drug use: No    ROS   Objective:   Vitals: BP 112/76 (BP Location: Left Arm)   Pulse 61   Temp 98 F (36.7 C) (Oral)   Resp 15   SpO2 98%   Physical Exam Constitutional:      General: He is not in acute distress.    Appearance: Normal appearance. He is well-developed and normal weight. He is not ill-appearing, toxic-appearing or diaphoretic.  HENT:     Head: Normocephalic and atraumatic.     Right Ear: External ear normal.     Left Ear: External ear normal.     Nose: Nose normal.     Mouth/Throat:     Pharynx: Oropharynx is clear. No oropharyngeal exudate or posterior oropharyngeal erythema.     Comments: No facial or oral swelling, throat closing sensation. Eyes:     General: No scleral icterus.       Right eye: No discharge.        Left eye: No discharge.     Extraocular Movements: Extraocular movements intact.  Cardiovascular:     Rate and Rhythm: Normal rate.  Pulmonary:     Effort: Pulmonary effort is normal.  Musculoskeletal:     Cervical back: Normal range of motion.  Skin:    Findings: Rash (Diffuse erythematic papular lesions in clusters, linear distribution of the forearms, lower legs extending to the thighs) present.  Neurological:     Mental Status: He is alert and oriented to person, place, and time.  Psychiatric:        Mood and Affect: Mood normal.        Behavior: Behavior normal.        Thought Content: Thought content normal.        Judgment: Judgment normal.    IM Solu-Medrol 125 mg in clinic.  Assessment and Plan :   PDMP not reviewed this encounter.  1. Irritant contact dermatitis due to plants, except food     Suspect irritant dermatitis, rest dermatitis.  Patient is starting off with the 40 mg dosing of prednisone.  Recommended that we use a steroid injection to supplement this given the extensive rash that he  has as I usually use a higher dosing initially of 60 mg.  Use hydroxyzine as well.  Complete the oral steroid course and hold off on the topical steroid.  Counseled patient on potential for adverse effects with medications prescribed/recommended today, ER and return-to-clinic precautions discussed, patient verbalized understanding.   Wallis Bamberg, [place] 06/10/22 1540

## 2022-12-21 ENCOUNTER — Other Ambulatory Visit: Payer: Self-pay

## 2022-12-21 ENCOUNTER — Encounter (HOSPITAL_COMMUNITY): Payer: Self-pay

## 2022-12-21 ENCOUNTER — Emergency Department (HOSPITAL_COMMUNITY)
Admission: EM | Admit: 2022-12-21 | Discharge: 2022-12-21 | Attending: Emergency Medicine | Admitting: Emergency Medicine

## 2022-12-21 DIAGNOSIS — E162 Hypoglycemia, unspecified: Secondary | ICD-10-CM | POA: Diagnosis present

## 2022-12-21 DIAGNOSIS — Z7951 Long term (current) use of inhaled steroids: Secondary | ICD-10-CM | POA: Diagnosis not present

## 2022-12-21 DIAGNOSIS — J45909 Unspecified asthma, uncomplicated: Secondary | ICD-10-CM | POA: Diagnosis not present

## 2022-12-21 LAB — I-STAT CHEM 8, ED
BUN: 19 mg/dL (ref 6–20)
Calcium, Ion: 1.16 mmol/L (ref 1.15–1.40)
Chloride: 101 mmol/L (ref 98–111)
Creatinine, Ser: 1.2 mg/dL (ref 0.61–1.24)
Glucose, Bld: 64 mg/dL — ABNORMAL LOW (ref 70–99)
HCT: 50 % (ref 39.0–52.0)
Hemoglobin: 17 g/dL (ref 13.0–17.0)
Potassium: 3.9 mmol/L (ref 3.5–5.1)
Sodium: 134 mmol/L — ABNORMAL LOW (ref 135–145)
TCO2: 21 mmol/L — ABNORMAL LOW (ref 22–32)

## 2022-12-21 LAB — CBG MONITORING, ED
Glucose-Capillary: 136 mg/dL — ABNORMAL HIGH (ref 70–99)
Glucose-Capillary: 43 mg/dL — CL (ref 70–99)
Glucose-Capillary: 148 mg/dL — ABNORMAL HIGH (ref 70–99)

## 2022-12-21 MED ORDER — DEXTROSE 50 % IV SOLN
INTRAVENOUS | Status: AC
  Administered 2022-12-21: 50 mL
  Filled 2022-12-21: qty 50

## 2022-12-21 MED ORDER — SODIUM CHLORIDE 0.9 % IV BOLUS
500.0000 mL | Freq: Once | INTRAVENOUS | Status: AC
  Administered 2022-12-21: 500 mL via INTRAVENOUS

## 2022-12-21 NOTE — ED Provider Notes (Signed)
[facility] EMERGENCY DEPARTMENT AT [facility] Provider Note  CSN: [id number] Arrival date & time: 12/21/22 0534  Chief Complaint(s) Hypoglycemia  HPI Derek Davis is a 32 y.o. male brought in by PD from [facility] detention [facility].  Patient was noted to be lethargic and weak.  Guards checked blood sugar level and noted that it was 65.  Patient has been on a hunger strike for the last 6 days.  HPI  Past Medical History Past Medical History:  Diagnosis Date   Asthma    There are no problems to display for this patient.  Home Medication(s) Prior to Admission medications   Medication Sig Start Date End Date Taking? Authorizing Provider  albuterol (PROVENTIL HFA;VENTOLIN HFA) 108 (90 BASE) MCG/ACT inhaler Inhale 2 puffs into the lungs every 6 (six) hours as needed for wheezing or shortness of breath (wheezing).     [provider]  ARIPiprazole (ABILIFY) 10 MG tablet Take 10 mg by mouth daily.    [provider]  budesonide-formoterol (SYMBICORT) 160-4.5 MCG/ACT inhaler Inhale 2 puffs into the lungs 2 (two) times daily.    [provider]  ibuprofen (ADVIL,MOTRIN) 800 MG tablet Take 1 tablet (800 mg total) by mouth 3 (three) times daily. Patient not taking: Reported on 09/01/2014 04/26/14   Elpidio Anis, PA-C  predniSONE (DELTASONE) 10 MG tablet Days 1-4 take 4 tablets (40 mg) daily  Days 5-8 take 3 tablets (30 mg) daily, Days 9-11 take 2 tablets (20 mg) daily, Days 12-14 take 1 tablet (10 mg) daily. 06/08/22   Freddy Finner, NP  triamcinolone (KENALOG) 0.025 % ointment Apply 1 Application topically 2 (two) times daily. 06/08/22   Freddy Finner, NP                                                                                                                                    Allergies Patient has no known allergies.  Review of Systems Review of Systems As noted in HPI  Physical Exam Vital Signs  I have reviewed the triage vital signs BP  127/82   Pulse 63   Temp 97.6 F (36.4 C) (Oral)   Resp 16   Ht 6' (1.829 m)   Wt 80.7 kg   SpO2 100%   BMI 24.13 kg/m   Physical Exam Vitals reviewed.  Constitutional:      General: He is not in acute distress.    Appearance: He is well-developed. He is not diaphoretic.  HENT:     Head: Normocephalic and atraumatic.     Right Ear: External ear normal.     Left Ear: External ear normal.     Nose: Nose normal.     Mouth/Throat:     Mouth: Mucous membranes are moist.  Eyes:     General: No scleral icterus.    Conjunctiva/sclera: Conjunctivae normal.  Neck:     Trachea: Phonation normal.  Cardiovascular:  Rate and Rhythm: Normal rate and regular rhythm.  Pulmonary:     Effort: Pulmonary effort is normal. No respiratory distress.     Breath sounds: No stridor.  Abdominal:     General: There is no distension.  Musculoskeletal:        General: Normal range of motion.     Cervical back: Normal range of motion.  Neurological:     Mental Status: He is alert and oriented to person, place, and time.  Psychiatric:        Behavior: Behavior normal.     ED Results and Treatments Labs (all labs ordered are listed, but only abnormal results are displayed) Labs Reviewed  CBG MONITORING, ED - Abnormal; Notable for the following components:      Result Value   Glucose-Capillary 43 (*)    All other components within normal limits  I-STAT CHEM 8, ED - Abnormal; Notable for the following components:   Sodium 134 (*)    Glucose, Bld 64 (*)    TCO2 21 (*)    All other components within normal limits  CBG MONITORING, ED - Abnormal; Notable for the following components:   Glucose-Capillary 136 (*)    All other components within normal limits  CBG MONITORING, ED - Abnormal; Notable for the following components:   Glucose-Capillary 148 (*)    All other components within normal limits                                                                                                                          EKG  EKG Interpretation Date/Time:    Ventricular Rate:    PR Interval:    QRS Duration:    QT Interval:    QTC Calculation:   R Axis:      Text Interpretation:         Radiology No results found.  Medications Ordered in ED Medications  dextrose 50 % solution (50 mLs  Given 12/21/22 0606)  sodium chloride 0.9 % bolus 500 mL (500 mLs Intravenous New Bag/Given 12/21/22 0606)   Procedures Procedures  (including critical care time) Medical Decision Making / ED Course   Medical Decision Making   Hyperglycemia in the setting of hunger strike.  Patient is a nondiabetic and does not take any medicines. CBG of 43 D50 given.  Patient also given IV fluids. I-STAT Chem-8 with mild hyperglycemia.  Other electrolytes are reassuring.  Renal function intact.  Serial CBGs uptrending after D50.  I spoke with patient and he agreed to eat.  Tolerated p.o.    Final Clinical Impression(s) / ED Diagnoses Final diagnoses:  Hypoglycemia   The patient appears reasonably screened and/or stabilized for discharge and I doubt any other medical condition or other [facility] requiring further screening, evaluation, or treatment in the ED at this time. I have discussed the findings, Dx and Tx plan with the patient/family who expressed understanding and agree(s) with the plan. Discharge instructions discussed at length. The  patient/family was given strict return precautions who verbalized understanding of the instructions. No further questions at time of discharge.  Disposition: Discharge  Condition: Good  ED Discharge Orders     None         Follow Up: Primary care provider  Call  to schedule an appointment for close follow up    This chart was dictated using voice recognition software.  Despite best efforts to proofread,  errors can occur which can change the documentation meaning.    Nira Conn, MD 12/21/22 [phone]

## 2022-12-21 NOTE — ED Triage Notes (Signed)
Pt BIB [place] [facility] d/t low blood sugar of 65 mg/dL.  Pt is not eating - on a "hunger strike".  He appears lethargic and weak so guards checked his Blood sugar.  He is not a known diabetic.  Pt states he last ate Thursday night.
